# Patient Record
Sex: Female | Born: 1960
Health system: Southern US, Community
[De-identification: ages and names within clinical notes are randomized; demographics above are authoritative.]

## PROBLEM LIST (undated history)

## (undated) DIAGNOSIS — F329 Major depressive disorder, single episode, unspecified: Secondary | ICD-10-CM

## (undated) DIAGNOSIS — K589 Irritable bowel syndrome without diarrhea: Secondary | ICD-10-CM

## (undated) DIAGNOSIS — F32A Depression, unspecified: Secondary | ICD-10-CM

## (undated) DIAGNOSIS — K219 Gastro-esophageal reflux disease without esophagitis: Secondary | ICD-10-CM

## (undated) HISTORY — DX: Major depressive disorder, single episode, unspecified: F32.9

## (undated) HISTORY — PX: CHOLECYSTECTOMY: SHX55

## (undated) HISTORY — PX: LITHOTRIPSY: SUR834

## (undated) HISTORY — DX: Depression, unspecified: F32.A

## (undated) HISTORY — DX: Gastro-esophageal reflux disease without esophagitis: K21.9

## (undated) HISTORY — DX: Irritable bowel syndrome, unspecified: K58.9

## (undated) HISTORY — PX: GALLBLADDER SURGERY: SHX652

---

## 1998-03-29 ENCOUNTER — Encounter: Admission: RE | Admit: 1998-03-29 | Discharge: 1998-03-29 | Payer: Self-pay | Admitting: Internal Medicine

## 1998-05-02 ENCOUNTER — Ambulatory Visit: Admission: RE | Admit: 1998-05-02 | Discharge: 1998-05-02 | Payer: Self-pay | Admitting: Internal Medicine

## 1998-05-08 ENCOUNTER — Ambulatory Visit: Admission: RE | Admit: 1998-05-08 | Discharge: 1998-05-08 | Payer: Self-pay | Admitting: Internal Medicine

## 1998-08-30 ENCOUNTER — Encounter: Admission: RE | Admit: 1998-08-30 | Discharge: 1998-08-30 | Payer: Self-pay | Admitting: Internal Medicine

## 1998-08-30 ENCOUNTER — Encounter: Payer: Self-pay | Admitting: Internal Medicine

## 1998-08-30 ENCOUNTER — Ambulatory Visit (HOSPITAL_COMMUNITY): Admission: RE | Admit: 1998-08-30 | Discharge: 1998-08-30 | Payer: Self-pay | Admitting: Internal Medicine

## 1999-06-19 ENCOUNTER — Encounter: Admission: RE | Admit: 1999-06-19 | Discharge: 1999-06-19 | Payer: Self-pay | Admitting: Internal Medicine

## 2000-09-04 ENCOUNTER — Ambulatory Visit (HOSPITAL_COMMUNITY): Admission: RE | Admit: 2000-09-04 | Discharge: 2000-09-04 | Payer: Self-pay

## 2000-09-04 ENCOUNTER — Encounter: Admission: RE | Admit: 2000-09-04 | Discharge: 2000-09-04 | Payer: Self-pay

## 2000-09-16 ENCOUNTER — Encounter: Admission: RE | Admit: 2000-09-16 | Discharge: 2000-09-16 | Payer: Self-pay | Admitting: Internal Medicine

## 2000-10-21 ENCOUNTER — Emergency Department (HOSPITAL_COMMUNITY): Admission: EM | Admit: 2000-10-21 | Discharge: 2000-10-21 | Payer: Self-pay

## 2000-10-21 ENCOUNTER — Encounter: Payer: Self-pay | Admitting: Emergency Medicine

## 2000-11-05 ENCOUNTER — Encounter: Admission: RE | Admit: 2000-11-05 | Discharge: 2000-11-05 | Payer: Self-pay | Admitting: Internal Medicine

## 2002-01-03 ENCOUNTER — Encounter: Admission: RE | Admit: 2002-01-03 | Discharge: 2002-01-03 | Payer: Self-pay

## 2002-03-24 ENCOUNTER — Encounter: Admission: RE | Admit: 2002-03-24 | Discharge: 2002-03-24 | Payer: Self-pay | Admitting: Internal Medicine

## 2003-11-08 ENCOUNTER — Encounter: Admission: RE | Admit: 2003-11-08 | Discharge: 2003-11-08 | Payer: Self-pay | Admitting: Internal Medicine

## 2003-11-13 ENCOUNTER — Encounter: Admission: RE | Admit: 2003-11-13 | Discharge: 2003-11-13 | Payer: Self-pay | Admitting: Internal Medicine

## 2004-01-02 ENCOUNTER — Encounter: Admission: RE | Admit: 2004-01-02 | Discharge: 2004-01-02 | Payer: Self-pay | Admitting: Internal Medicine

## 2004-07-03 ENCOUNTER — Ambulatory Visit: Admission: RE | Admit: 2004-07-03 | Discharge: 2004-07-03 | Payer: Self-pay | Admitting: Specialist

## 2004-12-09 ENCOUNTER — Encounter: Payer: Self-pay | Admitting: Internal Medicine

## 2005-03-01 ENCOUNTER — Emergency Department (HOSPITAL_COMMUNITY): Admission: EM | Admit: 2005-03-01 | Discharge: 2005-03-01 | Payer: Self-pay | Admitting: Emergency Medicine

## 2005-04-09 ENCOUNTER — Ambulatory Visit: Payer: Self-pay | Admitting: Internal Medicine

## 2006-10-17 DIAGNOSIS — F325 Major depressive disorder, single episode, in full remission: Secondary | ICD-10-CM | POA: Insufficient documentation

## 2006-10-17 DIAGNOSIS — K589 Irritable bowel syndrome without diarrhea: Secondary | ICD-10-CM | POA: Insufficient documentation

## 2006-10-17 DIAGNOSIS — K219 Gastro-esophageal reflux disease without esophagitis: Secondary | ICD-10-CM

## 2007-02-25 ENCOUNTER — Ambulatory Visit: Payer: Self-pay | Admitting: Family Medicine

## 2009-06-19 ENCOUNTER — Encounter: Admission: RE | Admit: 2009-06-19 | Discharge: 2009-06-19 | Payer: Self-pay | Admitting: Family Medicine

## 2009-06-19 ENCOUNTER — Encounter: Payer: Self-pay | Admitting: Family Medicine

## 2009-07-16 ENCOUNTER — Ambulatory Visit (HOSPITAL_COMMUNITY): Admission: RE | Admit: 2009-07-16 | Discharge: 2009-07-18 | Payer: Self-pay | Admitting: General Surgery

## 2009-07-16 ENCOUNTER — Encounter (INDEPENDENT_AMBULATORY_CARE_PROVIDER_SITE_OTHER): Payer: Self-pay | Admitting: General Surgery

## 2009-09-10 ENCOUNTER — Ambulatory Visit (HOSPITAL_COMMUNITY): Admission: RE | Admit: 2009-09-10 | Discharge: 2009-09-10 | Payer: Self-pay | Admitting: Urology

## 2009-12-20 ENCOUNTER — Ambulatory Visit (HOSPITAL_COMMUNITY): Admission: RE | Admit: 2009-12-20 | Discharge: 2009-12-20 | Payer: Self-pay | Admitting: Urology

## 2010-02-20 ENCOUNTER — Encounter: Payer: Self-pay | Admitting: Internal Medicine

## 2010-02-20 ENCOUNTER — Encounter (INDEPENDENT_AMBULATORY_CARE_PROVIDER_SITE_OTHER): Payer: Self-pay | Admitting: *Deleted

## 2010-02-20 ENCOUNTER — Ambulatory Visit: Payer: Self-pay | Admitting: Family Medicine

## 2010-02-20 DIAGNOSIS — R142 Eructation: Secondary | ICD-10-CM

## 2010-02-20 DIAGNOSIS — Z780 Asymptomatic menopausal state: Secondary | ICD-10-CM | POA: Insufficient documentation

## 2010-02-20 DIAGNOSIS — D1739 Benign lipomatous neoplasm of skin and subcutaneous tissue of other sites: Secondary | ICD-10-CM

## 2010-02-20 DIAGNOSIS — R143 Flatulence: Secondary | ICD-10-CM

## 2010-02-20 DIAGNOSIS — R141 Gas pain: Secondary | ICD-10-CM | POA: Insufficient documentation

## 2010-02-21 ENCOUNTER — Encounter: Payer: Self-pay | Admitting: Family Medicine

## 2010-02-21 ENCOUNTER — Telehealth (INDEPENDENT_AMBULATORY_CARE_PROVIDER_SITE_OTHER): Payer: Self-pay | Admitting: *Deleted

## 2010-02-21 LAB — CONVERTED CEMR LAB
Hep B S Ab: NEGATIVE
Sex Hormone Binding: 32 nmol/L (ref 18–114)
Testosterone Free: 4.6 pg/mL (ref 0.6–6.8)
Testosterone-% Free: 1.8 % (ref 0.4–2.4)

## 2010-02-22 LAB — CONVERTED CEMR LAB
BUN: 9 mg/dL (ref 6–23)
Basophils Relative: 0.4 % (ref 0.0–3.0)
CO2: 32 meq/L (ref 19–32)
Calcium: 9.3 mg/dL (ref 8.4–10.5)
Chloride: 102 meq/L (ref 96–112)
Direct LDL: 141.3 mg/dL
Eosinophils Absolute: 0.1 10*3/uL (ref 0.0–0.7)
HDL: 43.8 mg/dL (ref >39.00)
Lymphocytes Relative: 13.2 % (ref 12.0–46.0)
Lymphs Abs: 0.9 10*3/uL (ref 0.7–4.0)
MCV: 92.2 fL (ref 78.0–100.0)
Monocytes Relative: 7.9 % (ref 3.0–12.0)
Neutrophils Relative %: 77.4 % — ABNORMAL HIGH (ref 43.0–77.0)
Potassium: 4.4 meq/L (ref 3.5–5.1)
RBC: 4.06 M/uL (ref 3.87–5.11)
TSH: 2.53 microintl units/mL (ref 0.35–5.50)
Total CHOL/HDL Ratio: 5
Total Protein: 7.7 g/dL (ref 6.0–8.3)
VLDL: 48 mg/dL — ABNORMAL HIGH (ref 0.0–40.0)

## 2010-03-19 ENCOUNTER — Encounter: Admission: RE | Admit: 2010-03-19 | Discharge: 2010-03-19 | Payer: Self-pay | Admitting: Family Medicine

## 2010-03-19 ENCOUNTER — Telehealth (INDEPENDENT_AMBULATORY_CARE_PROVIDER_SITE_OTHER): Payer: Self-pay | Admitting: *Deleted

## 2010-03-26 ENCOUNTER — Telehealth (INDEPENDENT_AMBULATORY_CARE_PROVIDER_SITE_OTHER): Payer: Self-pay | Admitting: *Deleted

## 2010-03-27 ENCOUNTER — Encounter: Payer: Self-pay | Admitting: Family Medicine

## 2010-04-03 ENCOUNTER — Encounter: Payer: Self-pay | Admitting: Family Medicine

## 2010-04-11 ENCOUNTER — Encounter (INDEPENDENT_AMBULATORY_CARE_PROVIDER_SITE_OTHER): Payer: Self-pay | Admitting: *Deleted

## 2010-04-15 ENCOUNTER — Ambulatory Visit: Payer: Self-pay | Admitting: Internal Medicine

## 2010-05-02 ENCOUNTER — Ambulatory Visit: Payer: Self-pay | Admitting: Internal Medicine

## 2010-08-28 IMAGING — RF DG ERCP WO/W SPHINCTEROTOMY
5 series · 5 of 5 positions shown · non-contrast
Comparison: Intraoperative cholangiogram 07/16/2009

CLINICAL DATA: Retained common duct stone at cholecystectomy

ERCP
TECHNIQUE: Multiple spot images obtained with the fluoroscopic
device and submitted for interpretation post-procedure.  ERCP was
performed by Dr. Claus.

[Series 1: cont. · 1 of 1 slices shown (1 of 5)]
[im 1/1]
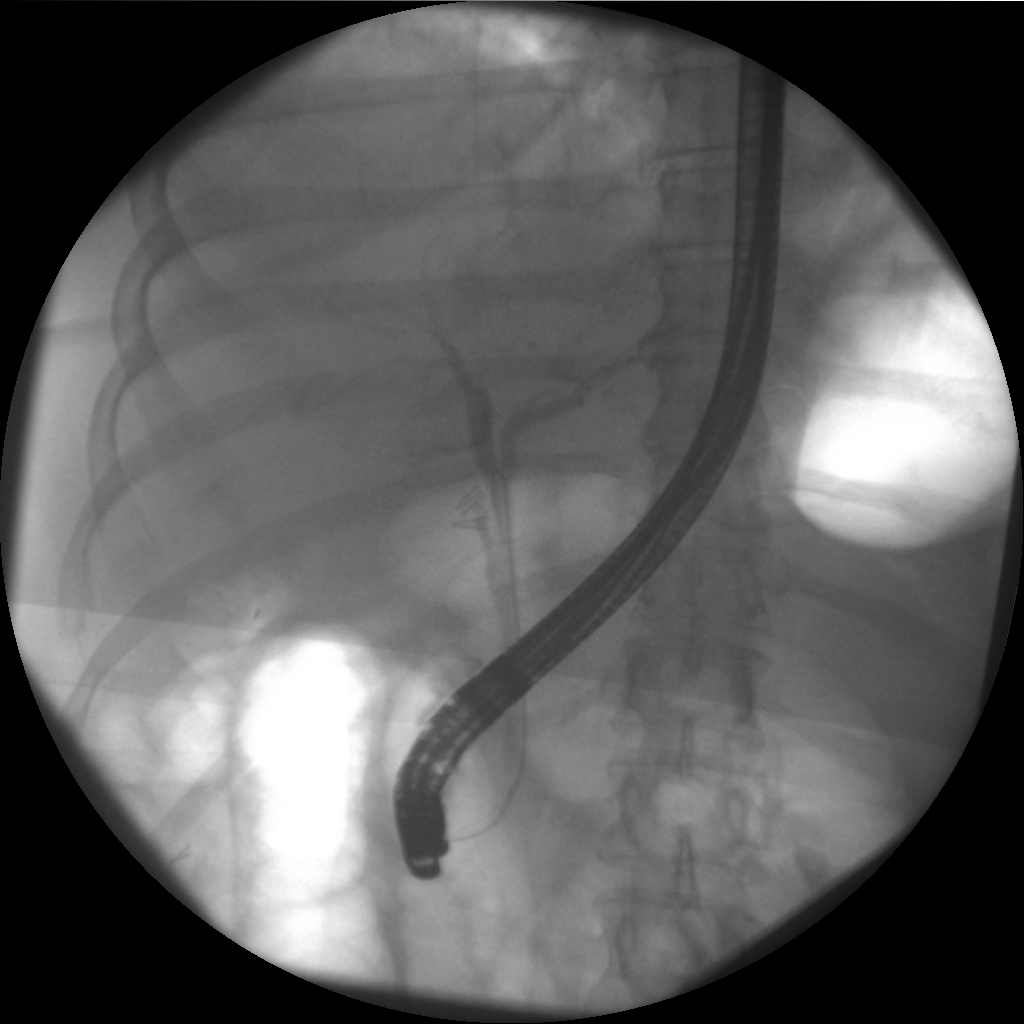

[Series 2: cont. · 1 of 1 slices shown (2 of 5)]
[im 1/1]
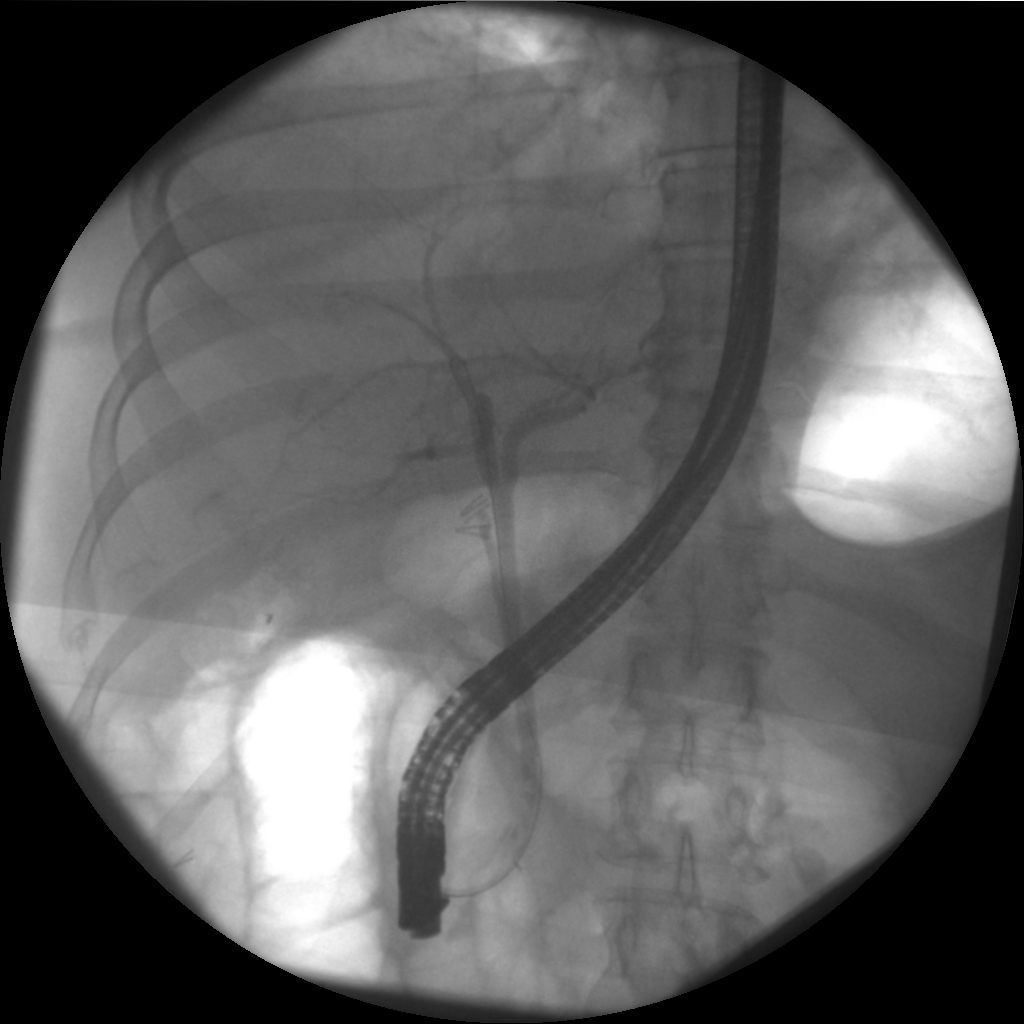

[Series 3: cont. · 1 of 1 slices shown (3 of 5)]
[im 1/1]
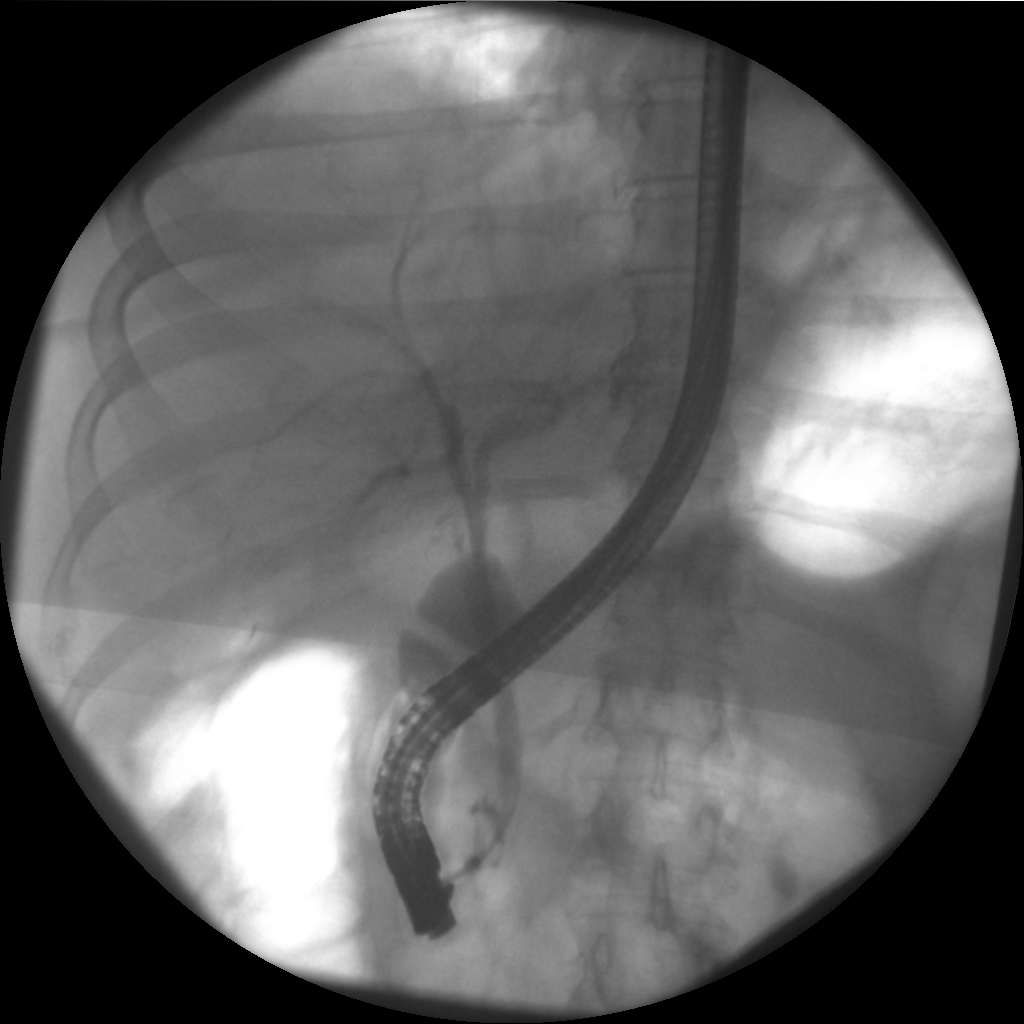

[Series 4: cont. · 1 of 1 slices shown (4 of 5)]
[im 1/1]
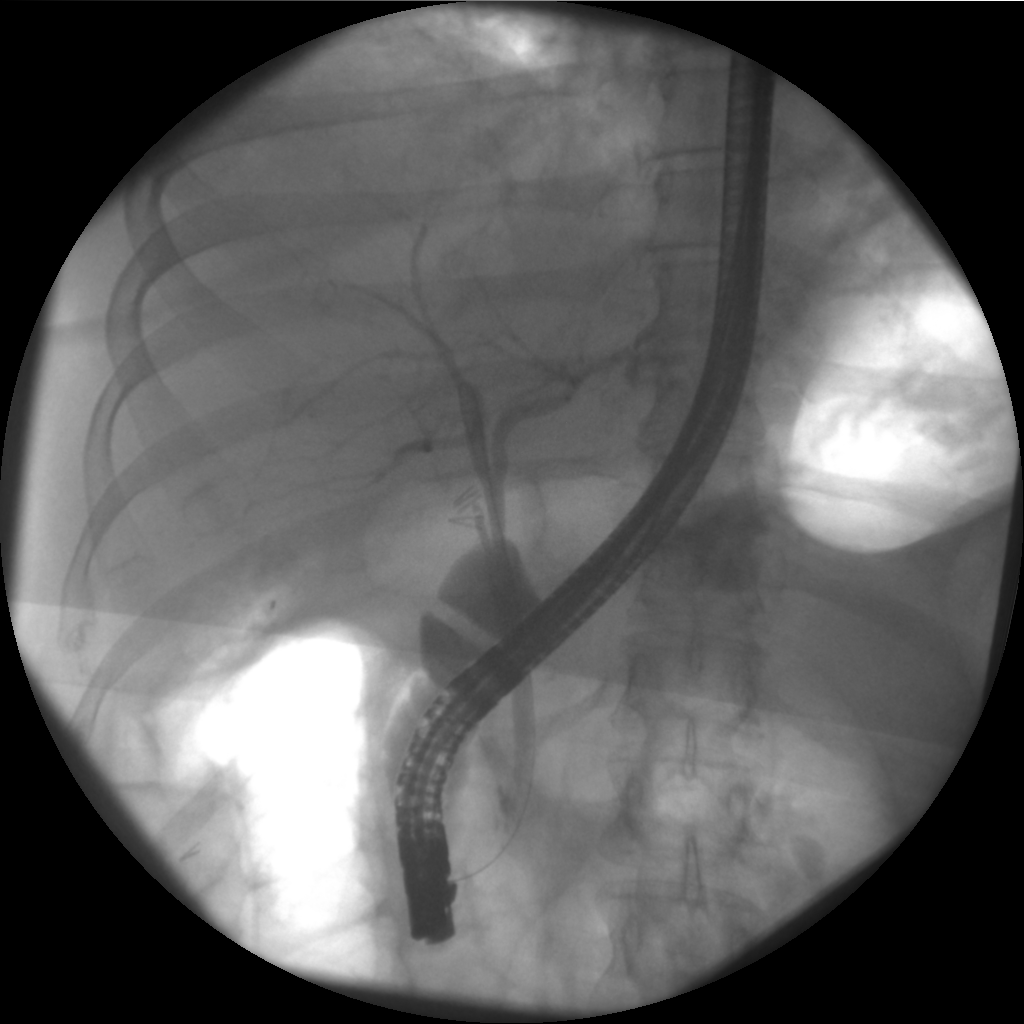

[Series 5: cont. · 1 of 1 slices shown (5 of 5)]
[im 1/1]
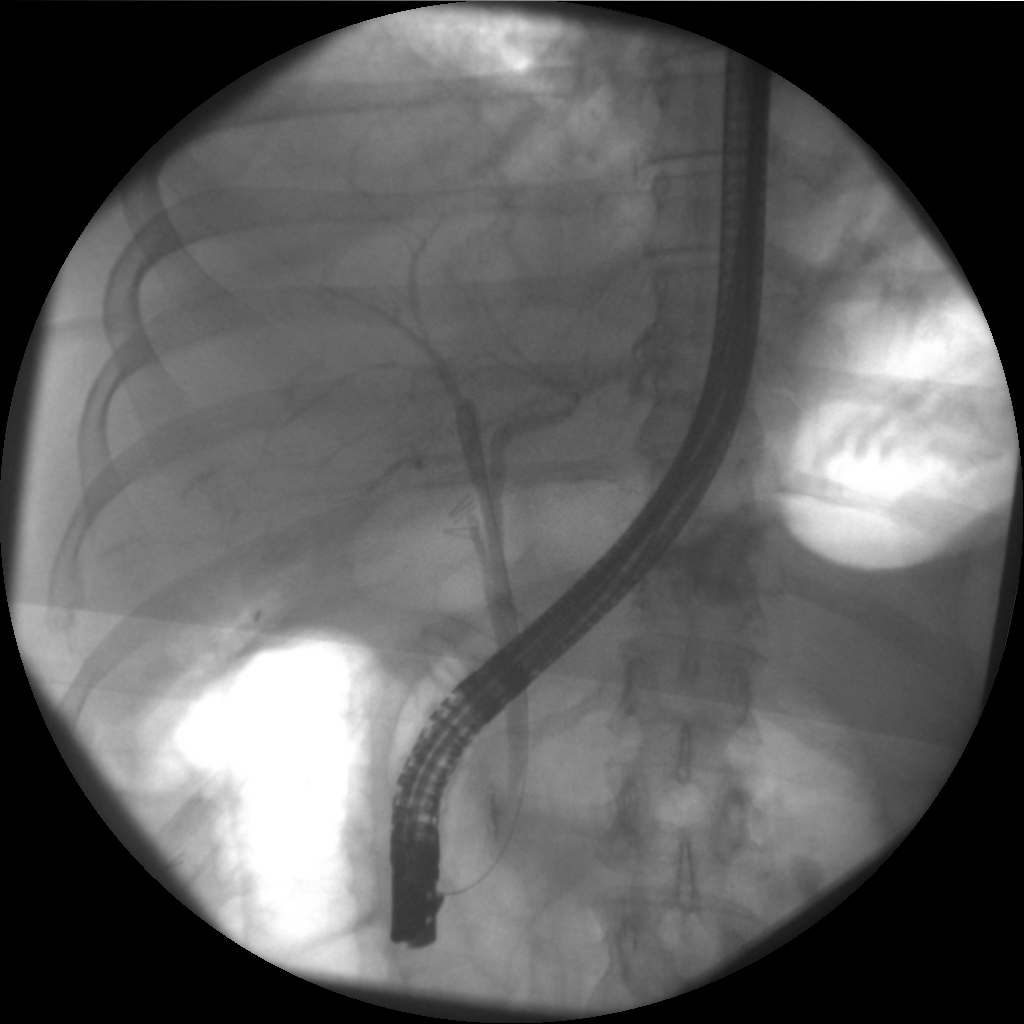

[5 of 5 positions shown; findings below may reference images not displayed]

FINDINGS: Five spot fluoroscopic images are presented for
evaluation.  Cholecystectomy clips are noted.  Common and
intrahepatic ducts are normal in caliber.  Cystic duct remnant is
partly opacified.  There is smooth tapering of the common duct to
the level of the ampulla with minimal apparent passage of contrast
into probable bowel.  There is a possible filling defect seen on
the second fluoroscopic image which is not visible on the last
provided image.
IMPRESSION: Apparent resolution of previously seen retained common duct stone.
No ductal dilatation.

These images were submitted for radiologic interpretation only.
Please see the procedural report for the amount of contrast and the
fluoroscopy time utilized.

## 2010-10-30 ENCOUNTER — Telehealth: Payer: Self-pay | Admitting: Family Medicine

## 2010-11-05 ENCOUNTER — Ambulatory Visit
Admission: RE | Admit: 2010-11-05 | Discharge: 2010-11-05 | Payer: Self-pay | Source: Home / Self Care | Attending: Family Medicine | Admitting: Family Medicine

## 2010-11-05 ENCOUNTER — Encounter: Payer: Self-pay | Admitting: Family Medicine

## 2010-11-05 ENCOUNTER — Other Ambulatory Visit: Payer: Self-pay | Admitting: Family Medicine

## 2010-11-05 DIAGNOSIS — E785 Hyperlipidemia, unspecified: Secondary | ICD-10-CM | POA: Insufficient documentation

## 2010-11-05 DIAGNOSIS — R599 Enlarged lymph nodes, unspecified: Secondary | ICD-10-CM | POA: Insufficient documentation

## 2010-11-05 LAB — BASIC METABOLIC PANEL
BUN: 14 mg/dL (ref 6–23)
CO2: 31 mEq/L (ref 19–32)
Calcium: 9.5 mg/dL (ref 8.4–10.5)
Chloride: 105 mEq/L (ref 96–112)
Creatinine, Ser: 0.7 mg/dL (ref 0.4–1.2)
GFR: 96.95 mL/min (ref >60.00)
Glucose, Bld: 91 mg/dL (ref 70–99)
Potassium: 4.5 mEq/L (ref 3.5–5.1)
Sodium: 142 mEq/L (ref 135–145)

## 2010-11-05 LAB — CBC WITH DIFFERENTIAL/PLATELET
Basophils Absolute: 0.1 10*3/uL (ref 0.0–0.1)
Basophils Relative: 0.8 % (ref 0.0–3.0)
Eosinophils Absolute: 0.2 10*3/uL (ref 0.0–0.7)
Eosinophils Relative: 2.8 % (ref 0.0–5.0)
HCT: 37.5 % (ref 36.0–46.0)
Hemoglobin: 12.7 g/dL (ref 12.0–15.0)
Lymphocytes Relative: 23.1 % (ref 12.0–46.0)
Lymphs Abs: 1.7 10*3/uL (ref 0.7–4.0)
MCHC: 33.9 g/dL (ref 30.0–36.0)
MCV: 94 fl (ref 78.0–100.0)
Monocytes Absolute: 0.5 10*3/uL (ref 0.1–1.0)
Monocytes Relative: 6.8 % (ref 3.0–12.0)
Neutro Abs: 5 10*3/uL (ref 1.4–7.7)
Neutrophils Relative %: 66.5 % (ref 43.0–77.0)
Platelets: 191 10*3/uL (ref 150.0–400.0)
RBC: 3.99 Mil/uL (ref 3.87–5.11)
RDW: 14.1 % (ref 11.5–14.6)
WBC: 7.5 10*3/uL (ref 4.5–10.5)

## 2010-11-07 ENCOUNTER — Encounter (INDEPENDENT_AMBULATORY_CARE_PROVIDER_SITE_OTHER): Payer: Self-pay | Admitting: *Deleted

## 2010-11-18 ENCOUNTER — Telehealth (INDEPENDENT_AMBULATORY_CARE_PROVIDER_SITE_OTHER): Payer: Self-pay | Admitting: *Deleted

## 2010-11-26 ENCOUNTER — Ambulatory Visit
Admission: RE | Admit: 2010-11-26 | Discharge: 2010-11-26 | Payer: Self-pay | Source: Home / Self Care | Attending: Family Medicine | Admitting: Family Medicine

## 2010-11-26 DIAGNOSIS — G5600 Carpal tunnel syndrome, unspecified upper limb: Secondary | ICD-10-CM | POA: Insufficient documentation

## 2010-11-26 DIAGNOSIS — R0609 Other forms of dyspnea: Secondary | ICD-10-CM | POA: Insufficient documentation

## 2010-11-26 DIAGNOSIS — R0989 Other specified symptoms and signs involving the circulatory and respiratory systems: Secondary | ICD-10-CM | POA: Insufficient documentation

## 2010-11-28 NOTE — Assessment & Plan Note (Signed)
Summary: CPX AND FASTING LABS, NO PAP///SPH   Vital Signs:  Patient profile:   50 year old female Height:      62 inches Weight:      207 pounds BMI:     38.00 Pulse rate:   76 / minute Pulse rhythm:   regular BP sitting:   122 / 80  (left arm) Cuff size:   regular  Vitals Entered By: Army Fossa CMA (February 20, 2010 8:30 AM) CC: Pt here for CPX and labs- pt is fasting no pap.     Last PAP Date 02/20/2010 Last PAP Result refused   History of Present Illness: Pt here to establish and have cpe.  Pt is fasting.  Pt does not want a pap because she doesn't feel like she needs one.  She does not sleep with men.   Pt having issues with hemrrhoids for 1 year.  No issues with constipation or straining.  They do bleed at times.  She has tried otc meds with no relief.    Pt is also having problems with hot flashes and decreased sex drive.  She feels like her hormones are "out of wack".---  no period since 04/2009 Pt with hx of psoriasis never diagnosed by derm -  pt using lotrimen on it with no relief.   Pt also has rash on inside R foot---very itchy.   Pt with hx kidney stones for about 1-2 years--- pt sees Dr Vonita Moss for this.  Pt is also c/o abd bloating---pt had abd [place] last  Pt also c/o lumps on bottom of feet --x 3 years--not painful.  Preventive Screening-Counseling & Management  Alcohol-Tobacco     Alcohol drinks/day: 0     Smoking Status: never  Caffeine-Diet-Exercise     Caffeine use/day: 3     Does Patient Exercise: no     Exercise Counseling: to improve exercise regimen  Hep-HIV-STD-Contraception     Dental Visit-last 6 months yes     Dental Care Counseling: not indicated; dental care within six months     SBE monthly: no     SBE Education/Counseling: to perform regular SBE      Sexual History:  same sex encounters and partner 4 years.        Drug Use:  never.    Current Medications (verified): 1)  Bee Pollen/royal Jelly/honey 1000 Mg Tabs (Nutritional  Supplements) .... 2 By Mouth Two Times A Day 2)  Zoloft 100 Mg Tabs (Sertraline Hcl) .Marland Kitchen.. 1 1/2 By Mouth Once Daily 3)  Lotrisone 1-0.05 % Crea (Clotrimazole-Betamethasone) .... Apply Two Times A Day As Needed 4)  Proctosol Hc 2.5 % Crea (Hydrocortisone) .... 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 Heart disease Family History of Alcoholism/Addiction Family History of Arthritis Family History Breast cancer 1st degree relative <50 Family  History Diabetes 1st degree relative Family History High cholesterol Family History of Stroke M 1st degree relative <50  Social History: Reviewed history and no changes required. 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 Eyes:  Denies blurring, discharge, double vision, eye irritation, eye pain, halos, itching, light sensitivity, red eye, vision loss-1 eye, and vision loss-both eyes; optho--q2y . ENT:  Denies decreased hearing, difficulty swallowing, ear discharge, earache, hoarseness, nasal congestion, nosebleeds, postnasal drainage, ringing in ears, sinus pressure, and sore throat. CV:  Denies bluish discoloration of lips or nails, chest pain or discomfort, difficulty breathing at night, difficulty breathing while lying down, fainting, fatigue, leg cramps with exertion, lightheadness, near fainting, palpitations, shortness of breath with exertion, swelling of feet, swelling of hands, and weight gain. Resp:  Denies chest discomfort, chest pain with inspiration, cough, coughing up blood, excessive snoring, hypersomnolence, morning headaches, pleuritic, shortness of breath, sputum productive, and wheezing. GI:  Complains of hemorrhoids; denies abdominal pain, bloody stools, change in bowel habits, constipation, dark tarry stools, diarrhea, excessive appetite, gas, indigestion, loss of appetite, nausea, and vomiting; + abd bloating since gb surgery. GU:  Denies abnormal vaginal bleeding, decreased libido, discharge, dysuria, genital sores, hematuria, incontinence, nocturia, urinary frequency, and urinary hesitancy. MS:  Denies joint pain, joint redness, joint swelling, loss of strength, low back pain, mid back pain, muscle aches, muscle , cramps, muscle weakness, stiffness, and thoracic pain. Derm:  Denies changes in color of skin, changes in nail beds, dryness, excessive perspiration, flushing, hair loss, insect bite(s), itching, lesion(s), poor wound healing, and rash; + dry white patches. Neuro:  Denies brief paralysis, difficulty with concentration, disturbances in coordination, falling down, headaches, inability to speak, memory loss, numbness, poor balance, seizures, sensation of room spinning, tingling,  tremors, visual disturbances, and weakness. Psych:  Complains of depression; denies alternate hallucination ( auditory/visual), anxiety, easily angered, easily tearful, irritability, mental problems, panic attacks, sense of great danger, suicidal thoughts/plans, thoughts of violence, unusual visions or sounds, and thoughts /plans of harming others. Endo:  Denies cold intolerance, excessive hunger, excessive thirst, excessive urination, heat intolerance, polyuria, and weight change. Heme:  Denies abnormal bruising, bleeding, enlarge lymph nodes, fevers, pallor, and skin discoloration. Allergy:  Denies hives or rash, itching eyes, persistent infections, seasonal allergies, and sneezing.  Physical Exam  General:  Well-developed,well-nourished,in no acute distress; alert,appropriate and cooperative throughout examination Head:  Normocephalic and atraumatic without obvious abnormalities. No apparent alopecia or balding. Eyes:  pupils equal, pupils round, pupils reactive to light, and no injection.   Ears:  External ear exam shows no significant lesions or deformities.  Otoscopic examination reveals clear canals, tympanic membranes are intact bilaterally without bulging, retraction, inflammation or discharge. Hearing is grossly normal bilaterally. Nose:  External nasal examination shows no deformity or inflammation. Nasal mucosa are pink and moist without lesions or exudates. Mouth:  Oral mucosa and oropharynx without lesions or exudates.  Teeth in good repair. Neck:  No deformities, masses, or tenderness noted.no carotid bruits.   Chest Wall:  No deformities, masses, or tenderness noted. Breasts:  No mass, nodules, thickening, tenderness, bulging,  retraction, inflamation, nipple discharge or skin changes noted.   Lungs:  Normal respiratory effort, chest expands symmetrically. Lungs are clear to auscultation, no crackles or wheezes. Heart:  normal rate and no murmur.   Abdomen:  Bowel sounds  positive,abdomen soft and non-tender without masses, organomegaly or hernias noted. Rectal:  external hemorrhoid(s).   heme negative brown stool Genitalia:  refused Msk:  normal ROM, no joint tenderness, no joint swelling, no joint warmth, no redness over joints, no joint deformities, no joint instability, and no crepitation.   Pulses:  R posterior tibial normal, R dorsalis pedis normal, R carotid normal, L posterior tibial normal, L dorsalis pedis normal, and L carotid normal.   Extremities:  No clubbing, cyanosis, edema, or deformity noted with normal full range of motion of all joints.   Neurologic:  No cranial nerve deficits noted. Station and gait are normal. Plantar reflexes are down-going bilaterally. DTRs are symmetrical throughout. Sensory, motor and coordinative functions appear intact. Skin:  white scaley circular patches on hands and R medial foot lipomas palpated bottem both feet Cervical Nodes:  No lymphadenopathy noted Axillary Nodes:  No palpable lymphadenopathy Psych:  Cognition and judgment appear intact. Alert and cooperative with normal attention span and concentration. No apparent delusions, illusions, hallucinations   Impression & Recommendations:  Problem # 1:  PREVENTIVE HEALTH CARE (ICD-V70.0)  Orders: Gastroenterology Referral (GI) Venipuncture (16109) TLB-Lipid Panel (80061-LIPID) TLB-BMP (Basic Metabolic Panel-BMET) (80048-METABOL) TLB-CBC Platelet - w/Differential (85025-CBCD) TLB-Hepatic/Liver Function Pnl (80076-HEPATIC) TLB-TSH (Thyroid Stimulating Hormone) (84443-TSH) TLB-A1C / Hgb A1C (Glycohemoglobin) (60454-U9W) Radiology Referral (Radiology) T-Hepatitis B Surface Antibody (11914-78295) T- * Misc. Laboratory test [phone]) EKG w/ Interpretation (93000)  Problem # 2:  POSTMENOPAUSAL STATUS (ICD-V49.81)  Orders: TLB-FSH (Follicle Stimulating Hormone) (83001-FSH) T-Hepatitis B Surface Antibody (86578-46962) T- * Misc. Laboratory test [phone]) EKG  w/ Interpretation (93000)  Problem # 3:  DEPRESSION (ICD-311)  The following medications were removed from the medication list:    Zoloft Tabs (Sertraline hcl tabs) .Marland KitchenMarland KitchenMarland KitchenMarland Kitchen 150 mg once daily Her updated medication list for this problem includes:    Zoloft 100 Mg Tabs (Sertraline hcl) .Marland Kitchen... 1 1/2 by mouth once daily  Orders: Venipuncture (13244) TLB-Lipid Panel (80061-LIPID) TLB-BMP (Basic Metabolic Panel-BMET) (80048-METABOL) TLB-CBC Platelet - w/Differential (85025-CBCD) TLB-Hepatic/Liver Function Pnl (80076-HEPATIC) TLB-TSH (Thyroid Stimulating Hormone) (84443-TSH) TLB-A1C / Hgb A1C (Glycohemoglobin) (83036-A1C) EKG w/ Interpretation (93000)  Discussed treatment options, including trial of antidpressant medication. Will refer to behavioral health. Follow-up call in in 24-48 hours and recheck in 2 weeks, sooner as needed. Patient agrees to call if any worsening of symptoms or thoughts of doing harm arise. Verified that the patient has no suicidal ideation at this time.   Problem # 4:  ABDOMINAL BLOATING (ICD-787.3)  will need pelvic US if not done pt refusing pelvic exam   Orders: EKG w/ Interpretation (93000)  Problem # 5:  COUGH (ICD-786.2)  ?allergies  allegra, claritin etc---rto prn  Orders: EKG w/ Interpretation (93000)  Problem # 6:  LIPOMA OF OTHER SKIN AND SUBCUTANEOUS TISSUE (ICD-214.1)  Orders: Podiatry Referral (Podiatry) EKG w/ Interpretation (93000)  Complete Medication List: 1)  Bee Pollen/royal Jelly/honey 1000 Mg Tabs (Nutritional supplements) .... 2 by mouth two times a day 2)  Zoloft 100 Mg Tabs (Sertraline hcl) .Marland Kitchen.. 1 1/2 by mouth once daily 3)  Lotrisone 1-0.05 % Crea (Clotrimazole-betamethasone) .... Apply two times a day as needed 4)  Proctosol Hc 2.5 % Crea (Hydrocortisone) .... Apply up to qid  Other Orders: Tdap => 24yrs  IM (16109) Hepatitis A Vaccine (Adult Dose) (60454) Admin 1st Vaccine (09811) Admin of Any Addtl Vaccine  (91478)  Patient Instructions: 1)  Take allegra or claritin otc for post nasal drip 2)  We will check Ultrasound to see if we need to order a pelvic US 3)  Call if hemorhoids or skin rash do not clear up Prescriptions: PROCTOSOL HC 2.5 % CREA (HYDROCORTISONE) apply up to qid  #1 tube x 1   Entered and Authorized by:   Loreen Freud DO   Signed by:   Loreen Freud DO on 02/20/2010   Method used:   Electronically to        [facility] [phone]* (retail)       [address] [place]  [address]       Ph: [phone] or [phone]       Fax: [phone]   RxID:   [id number] LOTRISONE 1-0.05 % CREA (CLOTRIMAZOLE-BETAMETHASONE) apply two times a day as needed  #30 g x 2   Entered and Authorized by:   Loreen Freud DO   Signed by:   Loreen Freud DO on 02/20/2010   Method used:   Electronically to        [facility] [phone]* (retail)       [address], [place]  [address]       Ph: [phone] or [phone]       Fax: [phone]   RxID:   [id number] ZOLOFT 100 MG TABS (SERTRALINE HCL) 1 1/2 by mouth once daily  #45 x 5   Entered and Authorized by:   Loreen Freud DO   Signed by:   Loreen Freud DO on 02/20/2010   Method used:   Print then Give to Patient   RxID:   [id number] ZOLOFT 50 MG TABS (SERTRALINE HCL) 1/2 by mouth once daily for 1 week then 1 by mouth once daily for 2 weeks then 2 by mouth once daily  #42 x 0   Entered and Authorized by:   Loreen Freud DO   Signed by:   Loreen Freud DO on 02/20/2010   Method used:   Electronically to        [facility] [phone]* (retail)       [address], [place]  [address]       Ph: [phone] or [phone]       Fax: [phone]   RxID:   [id number]    EKG  Procedure date:  02/20/2010  Findings:      Normal sinus rhythm with rate of:  70 bpm   Flu Vaccine Result Date:  08/08/2009 Flu Vaccine Result:  given Flu Vaccine Next Due:  1 yr PAP Result Date:   02/20/2010 PAP Result:  refused   Immunizations Administered:  Tetanus Vaccine:    Vaccine Type: Tdap    Site: right deltoid    Mfr: GlaxoSmithKline    Dose: 0.5 ml    Route: IM    Given by: Army Fossa CMA    Exp. Date: 01/19/2012    Lot #: [id number]  Hepatitis A Vaccine # 1:    Vaccine Type: HepA    Site: left deltoid    Mfr: GlaxoSmithKline    Dose: 1.0 ml    Route: IM    Given by: Army Fossa CMA  Exp. Date: 02/13/2012    Lot #: [id number]  Appended Document: Orders Update(lmom 4/27) [place] reviewd----pt needs US pelvis secondary to abd bloating  Left message for pt. Army Fossa CMA  February 20, 2010 1:03 PM  Pt has appt today 02/22/10. Army Fossa CMA  February 22, 2010 8:27 AM  Clinical Lists Changes  Orders: Added new Service order of UA Dipstick w/o Micro (manual) (09811) - Signed Added new Service order of Specimen Handling (99000) - Signed Added new Test order of T-Culture, Urine ([id number]) - Signed Added new Referral order of Radiology Referral (Radiology) - Signed Observations: Added new observation of COMMENTS: Floydene Flock  February 20, 2010 1:00 PM cx sent (02/20/2010 12:07) Added new observation of PH URINE: 5.0  (02/20/2010 12:07) Added new observation of APPEARANCE U: Clear  (02/20/2010 12:07) Added new observation of SPEC GR URIN: 1.020  (02/20/2010 12:07) Added new observation of UA COLOR: yellow  (02/20/2010 12:07) Added new observation of WBC DIPSTK U: small  (02/20/2010 12:07) Added new observation of NITRITE URN: negative  (02/20/2010 12:07) Added new observation of UROBILINOGEN: negative  (02/20/2010 12:07) Added new observation of PROTEIN, URN: negative  (02/20/2010 12:07) Added new observation of BLOOD UR DIP: small  (02/20/2010 12:07) Added new observation of KETONES URN: negative  (02/20/2010 12:07) Added new observation of BILIRUBIN UR: negative  (02/20/2010 12:07) Added new observation of GLUCOSE, URN: negative  (02/20/2010  12:07)      Laboratory Results   Urine Tests   Date/Time Reported: February 20, 2010 1:00 PM   Routine Urinalysis   Color: yellow Appearance: Clear Glucose: negative   (Normal Range: Negative) Bilirubin: negative   (Normal Range: Negative) Ketone: negative   (Normal Range: Negative) Spec. Gravity: 1.020   (Normal Range: 1.003-1.035) Blood: small   (Normal Range: Negative) pH: 5.0   (Normal Range: 5.0-8.0) Protein: negative   (Normal Range: Negative) Urobilinogen: negative   (Normal Range: 0-1) Nitrite: negative   (Normal Range: Negative) Leukocyte Esterace: small   (Normal Range: Negative)    Comments: Floydene Flock  February 20, 2010 1:00 PM cx sent

## 2010-11-28 NOTE — Progress Notes (Signed)
Summary: Lab Results   Phone Note Outgoing Call   Call placed by: Army Fossa CMA,  February 21, 2010 4:26 PM Summary of Call: Regarding lab results, LMTCB:  hep B neg--pt would need hep b vaccine fax estradial to gyn Signed by Loreen Freud DO on 02/21/2010 at 4:19 PM  send labs to gyn for hormone levels Ideally your LDL (bad cholesterol) should be <_100___, your HDL (good cholesterol) should be >_40__ and your triglycerides should be< 150.  Diet and exercise will increase HDL and decrease the LDL and triglycerides. Read Dr. Danice Goltz book--Eat Drink and Be Healthy.  We will recheck labs in __3_ months.  If still elevated we will discuss options------  NMR, hep 272.4  schedule ov 2 weeks after labs    Follow-up for Phone Call        Left message to call back. Army Fossa CMA  February 22, 2010 8:30 AM   Additional Follow-up for Phone Call Additional follow up Details #1::        I spoke with pt and she states she does not have a GYN doctor, she know that you will be out of the office until monday. Army Fossa CMA  February 22, 2010 8:54 AM     Additional Follow-up for Phone Call Additional follow up Details #2::    probably post menopausal---testosterone normal pt will need hep b vaccine as well as A Follow-up by: Loreen Freud DO,  Feb 25, 2010 12:11 PM  Additional Follow-up for Phone Call Additional follow up Details #3:: Details for Additional Follow-up Action Taken: Left message to call back. Army Fossa CMA  Feb 25, 2010 1:45 PM  Left message to call back, urine culture results are back also:  contaminated---repeat if symptomatic Signed by Loreen Freud DO on 02/25/2010 at 5:29 PM  Pt is aware. Army Fossa CMA  Feb 26, 2010 1:47 PM

## 2010-11-28 NOTE — Progress Notes (Signed)
Summary: needs appt to discuss labs    ---- Converted from flag ---- ---- 11/18/2010 8:07 AM, Almeta Monas CMA (AAMA) wrote:  please schedule. Thank You ---- 11/15/2010 1:09 PM, Loreen Freud DO wrote: pt needs ov soon to discuss labs ------------------------------       Additional Follow-up for Phone Call Additional follow up Details #2::    lmom ([phone]) to call and make regular followup appt to discuss labs.Sydney Thompson  November 18, 2010 1:10 PM  Additional Follow-up for Phone Call Additional follow up Details #3:: Details for Additional Follow-up Action Taken: Patient is coming in 1.31.11. Additional Follow-up by: Harold Barban,  November 18, 2010 2:47 PM

## 2010-11-28 NOTE — Letter (Signed)
Summary: Previsit letter  [facility]  [address] [place], [place] [address]   Phone: [phone]  Fax: [phone]       02/20/2010 MRN: [id number]  Sydney Thompson [address] [place], [place]  [address]  Dear Ms. [name],  Welcome to the Gastroenterology Division at [facility]    You are scheduled to see a nurse for your pre-procedure visit on 04/15/2010 at 8:00AM on the 3rd floor at [facility], [address]  We ask that you try to arrive at our office 15 minutes prior to your appointment time to allow for check-in.  Your nurse visit will consist of discussing your medical and surgical history, your immediate family medical history, and your medications.    Please bring a complete list of all your medications or, if you prefer, bring the medication bottles and we will list them.  We will need to be aware of both prescribed and over the counter drugs.  We will need to know exact dosage information as well.  If you are on blood thinners (Coumadin, Plavix, Aggrenox, Ticlid, etc.) please call our office today/prior to your appointment, as we need to consult with your physician about holding your medication.   Please be prepared to read and sign documents such as consent forms, a financial agreement, and acknowledgement forms.  If necessary, and with your consent, a friend or relative is welcome to sit-in on the nurse visit with you.  Please bring your insurance card so that we may make a copy of it.  If your insurance requires a referral to see a specialist, please bring your referral form from your primary care physician.  No co-pay is required for this nurse visit.     If you cannot keep your appointment, please call [phone] to cancel or reschedule prior to your appointment date.  This allows [place] the opportunity to schedule an appointment for another patient in need of care.    Thank you for choosing [facility] for your medical  needs.  We appreciate the opportunity to care for you.  Please visit [place] at our website  to learn more about our practice.                     Sincerely.                                                                                                                   The Gastroenterology Division

## 2010-11-28 NOTE — Letter (Signed)
Summary: [facility] Instructions  [facility]  [address] [place], [place] [address]   Phone: [phone]  Fax: [phone]       Dafney GINTZ    [birth year]    MRN: [id number]        Procedure Day /Date: Thursday 05/02/2010     Arrival Time: 12:30 pm      Procedure Time: 1:30 pm     Location of Procedure:                    _x _  [facility] (4th Floor)                        PREPARATION FOR COLONOSCOPY WITH MOVIPREP   Starting 5 days prior to your procedure Saturday 7/2 do not eat nuts, seeds, popcorn, corn, beans, peas,  salads, or any raw vegetables.  Do not take any fiber supplements (e.g. Metamucil, Citrucel, and Benefiber).  THE DAY BEFORE YOUR PROCEDURE         DATE: Wednesday 7/6  1.  Drink clear liquids the entire day-NO SOLID FOOD  2.  Do not drink anything colored red or purple.  Avoid juices with pulp.  No orange juice.  3.  Drink at least 64 oz. (8 glasses) of fluid/clear liquids during the day to prevent dehydration and help the prep work efficiently.  CLEAR LIQUIDS INCLUDE: Water Jello Ice Popsicles Tea (sugar ok, no milk/cream) Powdered fruit flavored drinks Coffee (sugar ok, no milk/cream) Gatorade Juice: apple, white grape, white cranberry  Lemonade Clear bullion, consomm, broth Carbonated beverages (any kind) Strained chicken noodle soup Hard Candy                             4.  In the morning, mix first dose of MoviPrep solution:    Empty 1 Pouch A and 1 Pouch B into the disposable container    Add lukewarm drinking water to the top line of the container. Mix to dissolve    Refrigerate (mixed solution should be used within 24 hrs)  5.  Begin drinking the prep at 5:00 p.m. The MoviPrep container is divided by 4 marks.   Every 15 minutes drink the solution down to the next mark (approximately 8 oz) until the full liter is complete.   6.  Follow completed prep with 16 oz of clear liquid of your choice  (Nothing red or purple).  Continue to drink clear liquids until bedtime.  7.  Before going to bed, mix second dose of MoviPrep solution:    Empty 1 Pouch A and 1 Pouch B into the disposable container    Add lukewarm drinking water to the top line of the container. Mix to dissolve    Refrigerate  THE DAY OF YOUR PROCEDURE      DATE: Thursday 7/7  Beginning at 8:30 a.m. (5 hours before procedure):         1. Every 15 minutes, drink the solution down to the next mark (approx 8 oz) until the full liter is complete.  2. Follow completed prep with 16 oz. of clear liquid of your choice.    3. You may drink clear liquids until 11:30 am (2 HOURS BEFORE PROCEDURE).   MEDICATION INSTRUCTIONS           OTHER INSTRUCTIONS  You will need a responsible adult at least 51 years of age to  accompany you and drive you home.   This person must remain in the waiting room during your procedure.  Wear loose fitting clothing that is easily removed.  Leave jewelry and other valuables at home.  However, you may wish to bring a book to read or  an iPod/MP3 player to listen to music as you wait for your procedure to start.  Remove all body piercing jewelry and leave at home.  Total time from sign-in until discharge is approximately 2-3 hours.  You should go home directly after your procedure and rest.  You can resume normal activities the  day after your procedure.  The day of your procedure you should not:   Drive   Make legal decisions   Operate machinery   Drink alcohol   Return to work  You will receive specific instructions about eating, activities and medications before you leave.    The above instructions have been reviewed and explained to me by   Clide Cliff, Rn_______________________    I fully understand and can verbalize these instructions _____________________________ Date _________

## 2010-11-28 NOTE — Progress Notes (Signed)
Summary: sertraline refill  Phone Note Refill Request Call back at Work Phone [phone] Message from:  Patient on October 30, 2010 12:01 PM  Refills Requested: Medication #1:  ZOLOFT 100 MG TABS 1 1/2 by mouth once daily. [facility]         Initial call taken by: Jerolyn Shin,  October 30, 2010 12:02 PM  Follow-up for Phone Call        last seen 02/20/10 and filled 03/26/10.Marland Kitchen Requesting a 3 mo supply. Next appt Jan 10 with Dr. Laury Axon. please advise Follow-up by: Almeta Monas CMA Duncan Dull),  October 30, 2010 2:43 PM  Additional Follow-up for Phone Call Additional follow up Details #1::        ok to refill 3 month supply with 3 refills Additional Follow-up by: Loreen Freud DO,  October 31, 2010 9:49 AM    Additional Follow-up for Phone Call Additional follow up Details #2::    pt made aware.... Almeta Monas CMA (AAMA)  October 31, 2010 11:01 AM   Prescriptions: ZOLOFT 100 MG TABS (SERTRALINE HCL) 1 1/2 by mouth once daily  #135 x 3   Entered by:   Almeta Monas CMA (AAMA)   Authorized by:   Loreen Freud DO   Signed by:   Almeta Monas CMA (AAMA) on 10/31/2010   Method used:   Faxed to ...       [facility] Environmental education officer)       [address] [address]       [place], [place]  [address]       Ph: [phone]       Fax: [phone]   RxID:   [id number]

## 2010-11-28 NOTE — Assessment & Plan Note (Signed)
Summary: lump on back of neck, discuss chol, flu shot, tb shot///sph   Vital Signs:  Patient profile:   50 year old female Weight:      197.8 pounds Pulse rate:   60 / minute BP sitting:   100 / 70  (left arm) Cuff size:   regular  Vitals Entered By: Almeta Monas CMA Duncan Dull) (November 05, 2010 8:17 AM) CC: [id number] c/o Lump on the neck--denies pain// wants TB skin test   History of Present Illness:  Hyperlipidemia follow-up      This is a 50 year old woman who presents for Hyperlipidemia follow-up.  The patient denies muscle aches, GI upset, abdominal pain, flushing, itching, constipation, diarrhea, and fatigue.  The patient denies the following symptoms: chest pain/pressure, exercise intolerance, dypsnea, palpitations, syncope, and pedal edema.  Compliance with medications (by patient report) has been near 100%.  Dietary compliance has been good.  The patient reports exercising occasionally.  Adjunctive measures currently used by the patient include fish oil supplements.    Pt also c/o lump R side neck x 3 months and she still has a cough.   + productive -- white sputum--no fever.     Preventive Screening-Counseling & Management  Alcohol-Tobacco     Alcohol drinks/day: 0     Smoking Status: never  Caffeine-Diet-Exercise     Caffeine use/day: 3     Does Patient Exercise: no     Exercise Counseling: to improve exercise regimen  Current Medications (verified): 1)  Zoloft 100 Mg Tabs (Sertraline Hcl) .Marland Kitchen.. 1 1/2 By Mouth Once Daily 2)  Calcium 600 1500 Mg Tabs (Calcium Carbonate) .Marland Kitchen.. 1 By Mouth Once Daily 3)  Fish Oil 1000 Mg Caps (Omega-3 Fatty Acids) .Marland Kitchen.. 1 By Mouth Once Daily 4)  Red Yeast Rice 600 Mg Caps (Red Yeast Rice Extract) .... 2 By Mouth Once Daily 5)  Augmentin 875-125 Mg Tabs (Amoxicillin-Pot Clavulanate) .Marland Kitchen.. 1 By Mouth Two Times A Day  Allergies (verified): No Known Drug Allergies  Past History:  Past medical, surgical, family and social histories (including  risk factors) reviewed for relevance to current acute and chronic problems.  Past Medical History: Reviewed history from 10/17/2006 and no changes required. Depression GERD IBS  Past Surgical History: Reviewed history from 02/20/2010 and no changes required. Cholecystectomy lithotripsy ---dr Vonita Moss  Family History: Reviewed history from 02/20/2010 and no changes required. Heart disease Family History of Alcoholism/Addiction Family History of Arthritis Family History Breast cancer 1st degree relative <50 Family History Diabetes 1st degree relative Family History High cholesterol Family History of Stroke M 1st degree relative <50  Social History: Reviewed history from 02/20/2010 and no changes required. 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  Teeth in good repair. Neck:  No deformities, masses, or tenderness noted. Lungs:  Normal respiratory effort, chest expands symmetrically. Lungs are clear to auscultation, no crackles or wheezes. Heart:  normal rate and no murmur.   Extremities:  No clubbing, cyanosis, edema, or deformity noted with normal full range of motion of all joints.   Cervical Nodes:  R post node,  non tender   Impression & Recommendations:  Problem # 1:  CERVICAL LYMPHADENOPATHY (ICD-785.6)  Her updated medication list for this problem includes:    Augmentin  875-125 Mg Tabs (Amoxicillin-pot clavulanate) .Marland Kitchen... 1 by mouth two times a day  Orders: TLB-CBC Platelet - w/Differential (85025-CBCD)  Warm, moist compresses and symptomatic measures. Advised patient to complete all antibiotics.  Problem # 2:  HYPERLIPIDEMIA (ICD-272.4)  Orders: Venipuncture  (04540) TLB-BMP (Basic Metabolic Panel-BMET) (80048-METABOL) T- * Misc. Laboratory test [phone])  Labs Reviewed: SGOT: 26 (02/20/2010)   SGPT: 29 (02/20/2010)   HDL:43.80 (02/20/2010)  Chol:221 (02/20/2010)  Trig:240.0 (02/20/2010)  Problem # 3:  COUGH (ICD-786.2) ? allergy ---zyrtec daily pt did not want a nasal spray  Complete Medication List: 1)  Zoloft 100 Mg Tabs (Sertraline hcl) .Marland Kitchen.. 1 1/2 by mouth once daily 2)  Calcium 600 1500 Mg Tabs (Calcium carbonate) .Marland Kitchen.. 1 by mouth once daily 3)  Fish Oil 1000 Mg Caps (Omega-3 fatty acids) .Marland Kitchen.. 1 by mouth once daily 4)  Red Yeast Rice 600 Mg Caps (Red yeast rice extract) .... 2 by mouth once daily 5)  Augmentin 875-125 Mg Tabs (Amoxicillin-pot clavulanate) .Marland Kitchen.. 1 by mouth two times a day  Other Orders: TB Skin Test [phone]) Admin 1st Vaccine (95621) Prescriptions: AUGMENTIN 875-125 MG TABS (AMOXICILLIN-POT CLAVULANATE) 1 by mouth two times a day  #20 x 0   Entered and Authorized by:   Loreen Freud DO   Signed by:   Loreen Freud DO on 11/05/2010   Method used:   [facility] to        [facility] [phone]* (retail)       [address], [place]  [address]       Ph: [phone] or [phone]       Fax: [phone]   RxID:   [id number]    Orders Added: 1)  TB Skin Test [86580] 2)  Admin 1st Vaccine [90471] 3)  Venipuncture [18841] 4)  TLB-BMP (Basic Metabolic Panel-BMET) [80048-METABOL] 5)  T- * Misc. Laboratory test [99999] 6)  TLB-CBC Platelet - w/Differential [85025-CBCD] 7)  Est. Patient Level III [66063]   Immunizations Administered:  PPD Skin Test:    Vaccine Type: PPD    Site: right forearm    Mfr: Sanofi Pasteur    Dose: 0.1 ml    Route: ID    Given by: Almeta Monas CMA (AAMA)    Exp. Date: 05/02/2012    Lot #: [id number]   Immunizations Administered:  PPD Skin Test:    Vaccine Type: PPD    Site: right forearm    Mfr: Sanofi Pasteur    Dose: 0.1 ml    Route: ID    Given by:  Almeta Monas CMA (AAMA)    Exp. Date: 05/02/2012    Lot #: [id number]  Prevention & Chronic Care Immunizations   Influenza vaccine: given  (02/20/2010)   Influenza vaccine due: 08/08/2010    Tetanus booster: 02/20/2010: Tdap    Pneumococcal vaccine: Not documented  Colorectal Screening   Hemoccult: Not documented    Colonoscopy: DONE  (05/02/2010)   Colonoscopy due: 04/2020  Other Screening   Pap smear: refused  (02/20/2010)   Pap smear due: 02/21/2011    Mammogram: Not documented   Smoking status: never  (11/05/2010)  Lipids   Total Cholesterol: 221  (02/20/2010)   LDL: Not documented   LDL Direct: 141.3  (02/20/2010)   HDL: 43.80  (02/20/2010)   Triglycerides: 240.0  (02/20/2010)    SGOT (AST): 26  (02/20/2010)   SGPT (ALT): 29  (02/20/2010)   Alkaline phosphatase: 117  (02/20/2010)  Total bilirubin: 0.5  (02/20/2010)  Self-Management Support :    Lipid self-management support: Not documented

## 2010-11-28 NOTE — Miscellaneous (Signed)
Summary: DIR COL...AS.  Clinical Lists Changes  Medications: Added new medication of MOVIPREP 100 GM  SOLR (PEG-KCL-NACL-NASULF-NA ASC-C) As directed - Signed Rx of MOVIPREP 100 GM  SOLR (PEG-KCL-NACL-NASULF-NA ASC-C) As directed;  #1 x 0;  Signed;  Entered by: Clide Cliff RN;  Authorized by: Hart Carwin MD;  Method used: [facility] [phone]*, [address], [place], [place]  [address], Ph: [phone] or [phone], Fax: [phone] Observations: Added new observation of ALLERGY REV: Done (04/15/2010 7:48)    Prescriptions: MOVIPREP 100 GM  SOLR (PEG-KCL-NACL-NASULF-NA ASC-C) As directed  #1 x 0   Entered by:   Clide Cliff RN   Authorized by:   Hart Carwin MD   Signed by:   Clide Cliff RN on 04/15/2010   Method used:   [facility]ronically to        [facility] [phone]* (retail)       [address]       [place], [place]  [address]       Ph: [phone] or [phone]       Fax: [phone]   RxID:   [id number]

## 2010-11-28 NOTE — Progress Notes (Signed)
Summary: Refill Request  Phone Note Refill Request Call back at [phone] Message from:  Pharmacy on Mar 26, 2010 12:40 PM  Refills Requested: Medication #1:  ZOLOFT 100 MG TABS 1 1/2 by mouth once daily.   Dosage confirmed as above?Dosage Confirmed   Brand Name Necessary? No   Supply Requested: 3 months   Notes: 4 refills [facility]  Next Appointment Scheduled: none Initial call taken by: Harold Barban,  Mar 26, 2010 12:40 PM    Prescriptions: ZOLOFT 100 MG TABS (SERTRALINE HCL) 1 1/2 by mouth once daily  #135 x 1   Entered by:   Army Fossa CMA   Authorized by:   Loreen Freud DO   Signed by:   Army Fossa CMA on 03/26/2010   Method used:   Faxed to ...       [facility] Environmental education officer)       [address] [address]       [place], [place]  [address]       Ph: [phone]       Fax: [phone]   RxID:   [id number]

## 2010-11-28 NOTE — Progress Notes (Signed)
Summary: [place] Results   Phone Note Outgoing Call   Call placed by: Army Fossa CMA,  Mar 19, 2010 10:07 AM Reason for Call: Discuss lab or test results Summary of Call: Regarding [place], LMTCB:  Pelvic US: Normal Transvaginal US: Normal  Follow-up for Phone Call        [facility]. Army Fossa CMA  Mar 20, 2010 10:22 AM   Additional Follow-up for Phone Call Additional follow up Details #1::        Patient aware of results Additional Follow-up by: Shonna Chock,  Mar 20, 2010 4:13 PM

## 2010-11-28 NOTE — Miscellaneous (Signed)
Summary: Immunization Entry    PPD Results    Date of reading: 11/07/2010    Results: < 5mm    Interpretation: negative 

## 2010-11-28 NOTE — Procedures (Signed)
Summary: Colonoscopy  Patient: [name] Note: All result statuses are Final unless otherwise noted.  Tests: (1) Colonoscopy (COL)   COL Colonoscopy           DONE     [facility]     [address]     [place], [place]  [address]           COLONOSCOPY PROCEDURE REPORT           PATIENT:  Sydney Thompson, Sydney Thompson  MR#:  [id number]     BIRTHDATE:  1960-12-28, 49 yrs. old  GENDER:  female     ENDOSCOPIST:  Hedwig Morton. Juanda Chance, MD     REF. BY:  Loreen Freud, DO     PROCEDURE DATE:  05/02/2010     PROCEDURE:  Colonoscopy [address]     ASA CLASS:  Class II     INDICATIONS:  Routine Risk Screening     MEDICATIONS:   Versed 6 mg, Fentanyl 50 mcg           DESCRIPTION OF PROCEDURE:   After the risks benefits and     alternatives of the procedure were thoroughly explained, informed     consent was obtained.  Digital rectal exam was performed and     revealed no rectal masses.   The Pentax Ped Colon [id number]     endoscope was introduced through the anus and advanced to the     cecum, which was identified by both the appendix and ileocecal     valve, without limitations.  The quality of the prep was good,     using MiraLax.  The instrument was then slowly withdrawn as the     colon was fully examined.     <<PROCEDUREIMAGES>>           FINDINGS:  Internal hemorrhoids were found (see image5).  This was     otherwise a normal examination of the colon (see image4, image3,     image2, and image1).   Retroflexed views in the rectum revealed no     abnormalities.    The scope was then withdrawn from the patient     and the procedure completed.           COMPLICATIONS:  None     ENDOSCOPIC IMPRESSION:     1) Internal hemorrhoids     2) Otherwise normal examination     RECOMMENDATIONS:     1) high fiber diet     REPEAT EXAM:  In 10 year(s) for.           ______________________________     Hedwig Morton. Juanda Chance, MD           CC:           n.     eSIGNED:   Hedwig Morton. Lianah Peed at 05/02/2010 01:59  PM           Janit Bern, [phone]  Note: An exclamation mark (!) indicates a result that was not dispersed into the flowsheet. Document Creation Date: 05/02/2010 1:59 PM _______________________________________________________________________  (1) Order result status: Final Collection or observation date-time: 05/02/2010 13:52 Requested date-time:  Receipt date-time:  Reported date-time:  Referring Physician:   Ordering Physician: Lina Sar [phone]) Specimen Source:  Source: Launa Grill Order Number: [id number] Lab site:   Appended Document: Colonoscopy    Clinical Lists Changes  Observations: Added new observation of COLONNXTDUE: 04/2020 (05/02/2010 14:06)

## 2010-11-29 NOTE — Consult Note (Signed)
Summary: [facility]   Imported By: Lanelle Bal 04/11/2010 08:47:17  _____________________________________________________________________  External Attachment:    Type:   Image     Comment:   External Document

## 2010-12-04 NOTE — Assessment & Plan Note (Signed)
Summary: DISCUSS LAB WORK//[facility]   Vital Signs:  Patient profile:   50 year old female Height:      62 inches Weight:      200.2 pounds BMI:     36.75 Pulse rate:   60 / minute Pulse rhythm:   regular BP sitting:   104 / 62  (left arm) Cuff size:   large  Vitals Entered By: Almeta Monas CMA Duncan Dull) (November 26, 2010 8:31 AM) CC: Review [facility]   History of Present Illness: Pt here to review labs. She also c/o CTS b/l --- pt has splints but has not worn them.   She is also requesting sleep evaluation because she snores.  She has never been told she stopped breathing however.    Current Medications (verified): 1)  Zoloft 100 Mg Tabs (Sertraline Hcl) .Marland Kitchen.. 1 1/2 By Mouth Once Daily 2)  Calcium 600 1500 Mg Tabs (Calcium Carbonate) .Marland Kitchen.. 1 By Mouth Once Daily 3)  Fish Oil 1000 Mg Caps (Omega-3 Fatty Acids) .Marland Kitchen.. 1 By Mouth Once Daily 4)  Red Yeast Rice 600 Mg Caps (Red Yeast Rice Extract) .... 2 By Mouth Once Daily 5)  Crestor 10 Mg Tabs (Rosuvastatin Calcium) .Marland Kitchen.. 1 By Mouth At Bedtime  Allergies (verified): No Known Drug Allergies  Past History:  Past Medical History: Last updated: 10/17/2006 Depression GERD IBS  Past Surgical History: Last updated: 02/20/2010 Cholecystectomy lithotripsy ---dr Vonita Moss  Family History: Last updated: 02/20/2010 Heart disease Family History of Alcoholism/Addiction Family History of Arthritis Family History Breast cancer 1st degree relative <50 Family History Diabetes 1st degree relative Family History High cholesterol Family History of Stroke M 1st degree relative <50  Social History: Last updated: 02/20/2010 Single Never Smoked Alcohol use-no Regular exercise-no 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 Heart disease Family History of Alcoholism/Addiction Family History of Arthritis Family History Breast cancer 1st degree relative <50 Family History Diabetes 1st degree relative Family History High cholesterol Family History of Stroke M 1st degree relative <50  Social History: Reviewed history from 02/20/2010 and no changes required. 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 Lungs are clear to auscultation, no crackles or wheezes. Msk:  numbness and tingling both hands Psych:  Oriented X3 and normally interactive.     Impression & Recommendations:  Problem # 1:  HYPERLIPIDEMIA (ICD-272.4)  Her updated medication list for this problem includes:    Crestor 10 Mg Tabs (Rosuvastatin calcium) .Marland Kitchen... 1 by mouth at bedtime  Labs Reviewed: SGOT: 26 (02/20/2010)   SGPT: 29 (02/20/2010)   HDL:43.80 (02/20/2010)  Chol:221 (02/20/2010)  Trig:240.0 (02/20/2010)  Problem # 2:  SNORING (ICD-786.09)  Orders: Sleep Disorder Referral (Sleep Disorder)  Problem # 3:  CARPAL TUNNEL SYNDROME, BILATERAL (ICD-354.0)  wear splints refer to hand surgeon if no better  Labs Reviewed: TSH: 2.53 (02/20/2010)   HgBA1c: 5.7 (02/20/2010)  Complete Medication List: 1)  Zoloft 100 Mg Tabs (Sertraline hcl) .Marland Kitchen.. 1 1/2 by mouth once daily 2)  Calcium 600 1500 Mg Tabs (Calcium carbonate) .Marland Kitchen.. 1 by mouth once daily 3)  Fish Oil  1000 Mg Caps (Omega-3 fatty acids) .Marland Kitchen.. 1 by mouth once daily 4)  Red Yeast Rice 600 Mg Caps (Red yeast rice extract) .... 2 by mouth once daily 5)  Crestor 10 Mg Tabs (Rosuvastatin calcium) .Marland Kitchen.. 1 by mouth at bedtime  Patient Instructions: 1)  fasting labs in 3  months---272.4   lipid, hep, crp Prescriptions: CRESTOR 10 MG TABS (ROSUVASTATIN CALCIUM) 1 by mouth at bedtime  #30 x 2   Entered and Authorized by:   Loreen Freud DO   Signed by:   Loreen Freud DO on 11/26/2010   Method used:   Print then Give to Patient   RxID:   [id number]    Orders Added: 1)  Sleep Disorder Referral [Sleep Disorder] 2)  Est. Patient Level III [[address]]

## 2010-12-19 ENCOUNTER — Encounter: Payer: Self-pay | Admitting: Pulmonary Disease

## 2010-12-19 ENCOUNTER — Institutional Professional Consult (permissible substitution) (INDEPENDENT_AMBULATORY_CARE_PROVIDER_SITE_OTHER): Payer: BC Managed Care – PPO | Admitting: Pulmonary Disease

## 2010-12-19 DIAGNOSIS — R0989 Other specified symptoms and signs involving the circulatory and respiratory systems: Secondary | ICD-10-CM

## 2011-01-02 NOTE — Assessment & Plan Note (Signed)
Summary: snoring //kp   Visit Type:  Initial Consult Copy to:  pcp Primary Provider/Referring Provider:  Laury Axon  CC:  Sleep consult. Pt c/o "loud snoring" and hand jerking.  History of Present Illness: 50/F  for evaluation of loud snoring. I sleep deep, tired in am, hands twitch, sleeps on her side x 2 pillows Epworth Sleepiness Score 5.Takes  2 h naps  bedtime 11-12 MN, sleep latency upto 30 m ins, 1 BR visit, oob 0830 on workdays, sleeps more on weekends but not refershing, witnessed apneas 2 yrs ago when she was with a friend in a hotel room. There is no history suggestive of cataplexy, sleep paralysis or parasomnias  She has not tried any treatments , no aggravating factors  Preventive Screening-Counseling & Management  Alcohol-Tobacco     Alcohol drinks/day: 0     Smoking Status: never   History of Present Illness: loud snoring  What time do you typically go to bed?(between what hours): 11-12  How long does it take you to fall asleep? sometimes immediately to 1/2 hr  How many times during the night do you wake up? once  What time do you get out of bed to start your day? 8:30 when I work  Do you drive or operate heavy machinery in your occupation? n/a  How much has your weight changed (up or down) over the past two years? (in pounds): gained 10  Have you ever had a sleep study before?  If yes,when and where: no  Do you currently use CPAP ? If so , at what pressure? no  Do you wear oxygen at any time? If yes, how many liters per minute? no Current Medications (verified): 1)  Zoloft 100 Mg Tabs (Sertraline Hcl) .Marland Kitchen.. 1 1/2 By Mouth Once Daily 2)  Calcium 600 1500 Mg Tabs (Calcium Carbonate) .Marland Kitchen.. 1 By Mouth Once Daily 3)  Fish Oil 1000 Mg Caps (Omega-3 Fatty Acids) .Marland Kitchen.. 1 By Mouth Once Daily 4)  Crestor 10 Mg Tabs (Rosuvastatin Calcium) .Marland Kitchen.. 1 By Mouth At Bedtime  Allergies (verified): No Known Drug Allergies  Past History:  Past Medical History: Last updated:  10/17/2006 Depression GERD IBS  Past Surgical History: Last updated: 02/20/2010 Cholecystectomy lithotripsy ---dr Vonita Moss  Family History: Last updated: 02/20/2010 Heart disease Family History of Alcoholism/Addiction Family History of Arthritis Family History Breast cancer 1st degree relative <50 Family History Diabetes 1st degree relative Family History High cholesterol Family History of Stroke M 1st degree relative <50  Social History: Last updated: 02/20/2010 Single Never Smoked Alcohol use-no Regular exercise-no 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  The patient denies shortness of breath with activity, shortness of breath at rest, coughing up blood, chest pain, irregular heartbeats, acid heartburn, indigestion, loss of appetite, abdominal pain, difficulty swallowing, sore throat, tooth/dental problems, headaches, nasal congestion/difficulty breathing through nose, sneezing, itching, ear ache, anxiety, depression, hand/feet swelling, joint stiffness or pain, rash, change in color of mucus, and fever.    Vital Signs:  Patient profile:   50 year old female Height:      62 inches Weight:      202 pounds BMI:     37.08 O2 Sat:      96 % on Room air Temp:     97.2 degrees F oral Pulse rate:   92 / minute BP sitting:   126 / 74  (left  arm) Cuff size:   regular  Vitals Entered By: Zackery Barefoot CMA (December 19, 2010 9:08 AM)  O2 Flow:  Room air CC: Sleep consult. Pt c/o "loud snoring" and hand jerking Comments Medications reviewed with patient Verified contact number and pharmacy with patient Zackery Barefoot CMA  December 19, 2010 9:09 AM    Physical Exam  Additional Exam:  Gen. Pleasant, well-nourished, in no distress, normal affect ENT - no lesions, no post nasal drip, class 2 airwy Neck: No JVD, no thyromegaly, no carotid bruits Lungs: no  use of accessory muscles, no dullness to percussion, clear without rales or rhonchi  Cardiovascular: Rhythm regular, heart sounds  normal, no murmurs or gallops, no peripheral edema Abdomen: soft and non-tender, no hepatosplenomegaly, BS normal. Musculoskeletal: No deformities, no cyanosis or clubbing Neuro:  alert, non focal     Impression & Recommendations:  Problem # 1:  SNORING (ICD-786.09) Loud snoring, excessive daytime somnolence & witnessed apneas - pre test probability of obstructive sleep apnea is high The pathophysiology of obstructive sleep apnea, it's cardiovascular consequences and modes of treatment including CPAP were discussed with the patient in great detail.  Orders: Consultation Level III (16109) Sleep Study (Sleep Study)  Patient Instructions: 1)  Copy sent to:Dr Lowne 2)  Please schedule a follow-up appointment in 2 weeks after study

## 2011-01-14 ENCOUNTER — Other Ambulatory Visit: Payer: Self-pay | Admitting: Family Medicine

## 2011-01-14 MED ORDER — ROSUVASTATIN CALCIUM 10 MG PO TABS: 10.0000 mg | ORAL_TABLET | Freq: Every day | ORAL | Status: DC

## 2011-01-20 ENCOUNTER — Ambulatory Visit (HOSPITAL_BASED_OUTPATIENT_CLINIC_OR_DEPARTMENT_OTHER): Payer: BC Managed Care – PPO | Attending: Pulmonary Disease

## 2011-01-20 DIAGNOSIS — G4733 Obstructive sleep apnea (adult) (pediatric): Secondary | ICD-10-CM | POA: Insufficient documentation

## 2011-01-23 DIAGNOSIS — G4733 Obstructive sleep apnea (adult) (pediatric): Secondary | ICD-10-CM

## 2011-01-29 ENCOUNTER — Telehealth: Payer: Self-pay | Admitting: Pulmonary Disease

## 2011-01-29 NOTE — Telephone Encounter (Signed)
Patient phoned stated that she just missed a call from our office. She can be reached at [phone] .Sydney Thompson

## 2011-01-29 NOTE — Telephone Encounter (Signed)
Pt requesting sleep study results. Please advise Dr. Vassie Loll thanks

## 2011-01-31 LAB — COMPREHENSIVE METABOLIC PANEL
ALT: 120 U/L — ABNORMAL HIGH (ref 0–35)
AST: 31 U/L (ref 0–37)
Alkaline Phosphatase: 109 U/L (ref 39–117)
CO2: 29 mEq/L (ref 19–32)
CO2: 29 mEq/L (ref 19–32)
Calcium: 9.5 mg/dL (ref 8.4–10.5)
Chloride: 105 mEq/L (ref 96–112)
Creatinine, Ser: 0.71 mg/dL (ref 0.4–1.2)
GFR calc Af Amer: >60 mL/min (ref >60)
GFR calc non Af Amer: >60 mL/min (ref >60)
Glucose, Bld: 103 mg/dL — ABNORMAL HIGH (ref 70–99)
Glucose, Bld: 106 mg/dL — ABNORMAL HIGH (ref 70–99)
Total Bilirubin: 0.6 mg/dL (ref 0.3–1.2)
Total Bilirubin: 0.8 mg/dL (ref 0.3–1.2)
Total Protein: 7.2 g/dL (ref 6.0–8.3)

## 2011-01-31 LAB — HEPATIC FUNCTION PANEL
ALT: 112 U/L — ABNORMAL HIGH (ref 0–35)
ALT: 86 U/L — ABNORMAL HIGH (ref 0–35)
Albumin: 3.3 g/dL — ABNORMAL LOW (ref 3.5–5.2)
Alkaline Phosphatase: 107 U/L (ref 39–117)
Indirect Bilirubin: 0.4 mg/dL (ref 0.3–0.9)
Total Bilirubin: 0.4 mg/dL (ref 0.3–1.2)
Total Bilirubin: 0.6 mg/dL (ref 0.3–1.2)
Total Protein: 6 g/dL (ref 6.0–8.3)
Total Protein: 6.8 g/dL (ref 6.0–8.3)

## 2011-01-31 LAB — DIFFERENTIAL
Basophils Relative: 1 % (ref 0–1)
Basophils Relative: 3 % — ABNORMAL HIGH (ref 0–1)
Lymphocytes Relative: 12 % (ref 12–46)
Lymphocytes Relative: 23 % (ref 12–46)
Monocytes Relative: 7 % (ref 3–12)
Neutro Abs: 8.2 10*3/uL — ABNORMAL HIGH (ref 1.7–7.7)

## 2011-01-31 LAB — LIPASE, BLOOD: Lipase: 16 U/L (ref 11–59)

## 2011-01-31 LAB — CBC
HCT: 35.7 % — ABNORMAL LOW (ref 36.0–46.0)
HCT: 37 % (ref 36.0–46.0)
Hemoglobin: 12 g/dL (ref 12.0–15.0)
RBC: 4.01 MIL/uL (ref 3.87–5.11)
WBC: 9 10*3/uL (ref 4.0–10.5)

## 2011-01-31 LAB — PROTIME-INR
INR: 1 (ref 0.00–1.49)
Prothrombin Time: 12.9 seconds (ref 11.6–15.2)

## 2011-02-03 ENCOUNTER — Telehealth: Payer: Self-pay | Admitting: Pulmonary Disease

## 2011-02-03 DIAGNOSIS — G4733 Obstructive sleep apnea (adult) (pediatric): Secondary | ICD-10-CM

## 2011-02-03 NOTE — Telephone Encounter (Signed)
Pl let her know that PSG showed obstructive sleep apnea where she stopped breathing 35 times per hour & her O2 level dropped. This ws corrected by CPAP If she is agreebale , can start CPAP If OK, write order for  CPAP 1 cm, small full face mask, humidity, download in 4 weeks

## 2011-02-04 NOTE — Telephone Encounter (Signed)
Read - cpap 10 cm

## 2011-02-05 ENCOUNTER — Telehealth: Payer: Self-pay | Admitting: Pulmonary Disease

## 2011-02-05 NOTE — Telephone Encounter (Signed)
NO

## 2011-02-05 NOTE — Telephone Encounter (Signed)
10 cm 

## 2011-02-05 NOTE — Telephone Encounter (Signed)
Pt wants to know if her hand movements/jerking are due to her sleep apnea. Dis her test mention her hand movements? Pls advise.

## 2011-02-05 NOTE — Telephone Encounter (Signed)
Informed pt of RA recs, pt agrees to start CPAP.  Dr. Vassie Loll please verify setting before I send in the order. Thanks.

## 2011-02-06 ENCOUNTER — Encounter: Payer: Self-pay | Admitting: Pulmonary Disease

## 2011-02-06 NOTE — Telephone Encounter (Signed)
lmomtcb x1  Mindy Silva, CMA  

## 2011-02-06 NOTE — Telephone Encounter (Signed)
Pt returned call from triage. Kathleen Perdue  °

## 2011-02-06 NOTE — Telephone Encounter (Signed)
Spoke w/ pt and advised her to give cpap a try to see if this corrects it or not. Pt verbalized understanding

## 2011-02-06 NOTE — Telephone Encounter (Signed)
I do not have an explanation Let us see if CPAP corrects this.

## 2011-02-06 NOTE — Telephone Encounter (Signed)
Pt states when she falls asleep she her hands jerk. Pt states she doesn't understand why it does that then. Pt states she has informed Dr. Vassie Loll of this before but has never addressed this. Please advise Dr. Vassie Loll. Thanks  Sydney Thompson, CMA

## 2011-02-07 ENCOUNTER — Telehealth: Payer: Self-pay | Admitting: *Deleted

## 2011-02-07 NOTE — Telephone Encounter (Signed)
Its a good screening tool if she has the extra money to have it done

## 2011-02-07 NOTE — Telephone Encounter (Signed)
Pt aware.

## 2011-02-07 NOTE — Telephone Encounter (Signed)
Pt states that she received a form in mail from [facility] for screening test that cost $169. The screening is suppose to check for risk of stroke, heart abnormality/rhythm, chronic kidney disease, abdominal aorta aneurysm , osteoporosis, arteries, and peripheral arteries. Pt would like to know if dr Laury Axon thinks this screening is worth doing. Please advise

## 2011-02-17 NOTE — Telephone Encounter (Signed)
I did not call her Sleep study results have been given. Pl see earlier phone notes

## 2011-02-17 NOTE — Telephone Encounter (Signed)
Dr. Alva please advise. Thanks. 

## 2011-02-24 ENCOUNTER — Other Ambulatory Visit: Payer: Self-pay

## 2011-02-24 NOTE — Telephone Encounter (Signed)
Looks like there was a duplicate phone message on this.  Per phone message from 02/03/11, pt was informed of results and was in agreement to start cpap.  LMOMTCB to see if anything further is needed regarding this.

## 2011-02-25 ENCOUNTER — Other Ambulatory Visit: Payer: Self-pay

## 2011-02-25 ENCOUNTER — Telehealth: Payer: Self-pay | Admitting: Pulmonary Disease

## 2011-02-25 NOTE — Telephone Encounter (Signed)
Unsure who called pt.  I don't see any documentation in her chart that triage called her recently.  Last phone note from 02/05/2011 was signed off and completed.  ATC pt to get more info.  NA and unable to leave message.  WCB.

## 2011-02-26 NOTE — Telephone Encounter (Signed)
lmomtcb  

## 2011-02-27 ENCOUNTER — Other Ambulatory Visit (INDEPENDENT_AMBULATORY_CARE_PROVIDER_SITE_OTHER): Payer: BC Managed Care – PPO

## 2011-02-27 DIAGNOSIS — E785 Hyperlipidemia, unspecified: Secondary | ICD-10-CM

## 2011-02-27 LAB — HEPATIC FUNCTION PANEL
AST: 33 U/L (ref 0–37)
Albumin: 3.7 g/dL (ref 3.5–5.2)
Alkaline Phosphatase: 84 U/L (ref 39–117)
Total Protein: 7 g/dL (ref 6.0–8.3)

## 2011-02-27 LAB — LIPID PANEL
Cholesterol: 147 mg/dL (ref 0–200)
HDL: 43.3 mg/dL (ref >39.00)
Triglycerides: 137 mg/dL (ref 0.0–149.0)

## 2011-02-27 NOTE — Telephone Encounter (Signed)
lmomtcb x 2  

## 2011-02-28 LAB — C-REACTIVE PROTEIN: CRP: 1.2 mg/dL — ABNORMAL HIGH (ref <0.6)

## 2011-02-28 NOTE — Telephone Encounter (Signed)
lmomtcb x 3  

## 2011-03-03 NOTE — Telephone Encounter (Signed)
We have attempted to contact pt and LMOMTCB x 3.  Per protocol, will sign off on this message and wait for pt to return our messages.

## 2011-04-04 ENCOUNTER — Telehealth: Payer: Self-pay | Admitting: *Deleted

## 2011-04-04 NOTE — Telephone Encounter (Signed)
Phone call complete

## 2011-04-08 ENCOUNTER — Telehealth: Payer: Self-pay | Admitting: Pulmonary Disease

## 2011-04-08 NOTE — Telephone Encounter (Signed)
I can see her to review sleep study if I have morning appt available.  Please explain to her that she would then need to follow up with Dr. Vassie Loll afterward.

## 2011-04-08 NOTE — Telephone Encounter (Signed)
thx Vineet

## 2011-04-08 NOTE — Telephone Encounter (Signed)
Called and spoke with pt.  Pt is aware of RA's response.  Pt states she does want to come in for a visit to stay in compliance with insurance requirements but can only come in on Tues, Weds, and Friday Mornings and you're schedule for June and July are only PM appts. RA, do you have any recs or ideas on how we can help this pt and get her in for an appt. Thanks.

## 2011-04-08 NOTE — Telephone Encounter (Signed)
Called and spoke with pt.  Pt states she was told to f/u with RA after having her sleep study.  Pt states she is only able to come to the office on Tuesdays, Wednesdays and Fridays in the mornings.  The only appts RA has avail for the months of June and July are PM appts (august schedule isn't out yet)  Pt wanted to know if RA would be able to just discuss things with her over the phone with her until she is able to schedule an appt.  Please advise.  Thank you.

## 2011-04-08 NOTE — Telephone Encounter (Signed)
Will forward message to [facility] and [facility] to see if they would be willing to see pt for Dr. Vassie Thompson.

## 2011-04-08 NOTE — Telephone Encounter (Signed)
Pl ask if one of my partners - [facility] or [facility] can see this pt  For this purpose.

## 2011-04-08 NOTE — Telephone Encounter (Signed)
I can discuss  - but medicare requires a face to face meeting - otherwise they will not pay for her CPAP

## 2011-04-09 NOTE — Telephone Encounter (Signed)
Will forward to [facility] to see if he is kay with seeing pt to review these results, since [facility] has no am appts avaialble June or July. Pls advise, thanks!

## 2011-04-14 NOTE — Telephone Encounter (Signed)
[email or web address].Vanita Cannell, CMA  

## 2011-04-14 NOTE — Telephone Encounter (Signed)
Scheduled appt w/Dr. Shelle Iron 7/3 @ 10:15am.  Patient aware.

## 2011-04-14 NOTE — Telephone Encounter (Signed)
Ok with me 

## 2011-04-28 ENCOUNTER — Encounter: Payer: Self-pay | Admitting: Pulmonary Disease

## 2011-04-29 ENCOUNTER — Ambulatory Visit (INDEPENDENT_AMBULATORY_CARE_PROVIDER_SITE_OTHER): Payer: BC Managed Care – PPO | Admitting: Pulmonary Disease

## 2011-04-29 ENCOUNTER — Encounter: Payer: Self-pay | Admitting: Pulmonary Disease

## 2011-04-29 VITALS — BP 116/62 | HR 80 | Temp 97.8°F | Ht 62.0 in | Wt 217.2 lb

## 2011-04-29 DIAGNOSIS — G4733 Obstructive sleep apnea (adult) (pediatric): Secondary | ICD-10-CM

## 2011-04-29 NOTE — Patient Instructions (Signed)
Will have your cpap increased to 12cm, which is your optimal level based on sleep study Work on weight loss followup with Dr. Vassie Loll in 6mos

## 2011-04-29 NOTE — Progress Notes (Signed)
  Subjective:    Patient ID: Sydney Thompson, female    DOB: 11-Aug-1961, 50 y.o.   MRN: [id number]  HPI The pt comes in today for f/u of her known moderate osa.  She has been wearing cpap compliantly, and feels she has seen a significant improvement in her sleep.  She denies any issues with pressure or mask fit.     Review of Systems  Constitutional: Negative for fever and unexpected weight change.  HENT: Positive for congestion, rhinorrhea and postnasal drip. Negative for ear pain, nosebleeds, sore throat, sneezing, trouble swallowing, dental problem and sinus pressure.   Eyes: Negative for redness and itching.  Respiratory: Positive for cough. Negative for chest tightness, shortness of breath and wheezing.   Cardiovascular: Negative for palpitations and leg swelling.  Gastrointestinal: Negative for nausea and vomiting.  Genitourinary: Negative for dysuria.  Musculoskeletal: Negative for joint swelling.  Skin: Negative for rash.  Neurological: Negative for headaches.  Hematological: Does not bruise/bleed easily.  Psychiatric/Behavioral: Negative for dysphoric mood. The patient is not nervous/anxious.        Objective:   Physical Exam Ow female in nad No skin breakdown or pressure necrosis from cpap mask LE without significant edema, no cyanosis noted. Alert, does not appear sleepy, moves all 4        Assessment & Plan:

## 2011-04-29 NOTE — Assessment & Plan Note (Signed)
The pt is doing well with cpap, and reports definite improvement in her sleep and daytime alertness.  She is having no mask or pressure issues.  I will have her pressure increased to her optimal level of 12cm, and she is to followup with Dr. Vassie Loll in 6mos.  I have also encouraged her to work on weight loss, and to call if having issues with cpap/sleep.

## 2011-06-03 ENCOUNTER — Telehealth: Payer: Self-pay | Admitting: Pulmonary Disease

## 2011-06-03 ENCOUNTER — Other Ambulatory Visit: Payer: Self-pay | Admitting: Family Medicine

## 2011-06-03 DIAGNOSIS — G4733 Obstructive sleep apnea (adult) (pediatric): Secondary | ICD-10-CM

## 2011-06-03 NOTE — Telephone Encounter (Signed)
Order has been sent to [facility]  lmomtcb to advise pt

## 2011-06-03 NOTE — Telephone Encounter (Signed)
Pt aware order was sent to decrease pressure to 10 cm. Pt verbalized understanding

## 2011-06-03 NOTE — Telephone Encounter (Signed)
Spoke with pt and she states since her cpap pressure was changed to 12 she is not able to wear her cpap all night. Pt states theirs nights she is only able to wear her cpap x 1 hr. Pt states it is to much air blowing out in the mask and it wakes her up. Pt states when she was set at 10 she was able to wear her cpap all night and wants to go back to that setting. Please advise Dr. Vassie Loll. Thanks  Carver Fila, CMA

## 2011-06-03 NOTE — Telephone Encounter (Signed)
Ok to decrease to 10 cm again

## 2011-06-03 NOTE — Telephone Encounter (Signed)
Pt return call. Krista L Lilly  °

## 2011-10-20 ENCOUNTER — Encounter: Payer: Self-pay | Admitting: Family Medicine

## 2011-10-30 ENCOUNTER — Ambulatory Visit (INDEPENDENT_AMBULATORY_CARE_PROVIDER_SITE_OTHER): Payer: BC Managed Care – PPO

## 2011-10-30 DIAGNOSIS — Z1329 Encounter for screening for other suspected endocrine disorder: Secondary | ICD-10-CM

## 2011-11-02 ENCOUNTER — Encounter (INDEPENDENT_AMBULATORY_CARE_PROVIDER_SITE_OTHER): Payer: BC Managed Care – PPO

## 2011-11-02 DIAGNOSIS — R059 Cough, unspecified: Secondary | ICD-10-CM

## 2011-11-02 DIAGNOSIS — N390 Urinary tract infection, site not specified: Secondary | ICD-10-CM

## 2011-11-02 DIAGNOSIS — J040 Acute laryngitis: Secondary | ICD-10-CM

## 2011-11-02 DIAGNOSIS — R05 Cough: Secondary | ICD-10-CM

## 2011-11-18 ENCOUNTER — Ambulatory Visit (INDEPENDENT_AMBULATORY_CARE_PROVIDER_SITE_OTHER): Payer: Self-pay | Admitting: Family Medicine

## 2011-11-18 ENCOUNTER — Encounter: Payer: Self-pay | Admitting: Family Medicine

## 2011-11-18 VITALS — BP 112/66 | HR 72 | Temp 98.7°F | Ht 62.0 in | Wt 223.6 lb

## 2011-11-18 DIAGNOSIS — L409 Psoriasis, unspecified: Secondary | ICD-10-CM

## 2011-11-18 DIAGNOSIS — Z0000 Encounter for general adult medical examination without abnormal findings: Secondary | ICD-10-CM

## 2011-11-18 DIAGNOSIS — L408 Other psoriasis: Secondary | ICD-10-CM

## 2011-11-18 DIAGNOSIS — E785 Hyperlipidemia, unspecified: Secondary | ICD-10-CM

## 2011-11-18 DIAGNOSIS — F329 Major depressive disorder, single episode, unspecified: Secondary | ICD-10-CM

## 2011-11-18 DIAGNOSIS — F3289 Other specified depressive episodes: Secondary | ICD-10-CM

## 2011-11-18 MED ORDER — CLOBETASOL PROPIONATE 0.05 % EX FOAM: Freq: Two times a day (BID) | CUTANEOUS | Status: DC

## 2011-11-18 NOTE — Progress Notes (Signed)
  Subjective:     Sydney Thompson is a 51 y.o. female and is here for a comprehensive physical exam. The patient reports no problems.  History   Social History  . Marital Status: Single    Spouse Name: N/A    Number of Children: N/A  . Years of Education: N/A   Occupational History  . 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 Smoking status: Never Smoker   . Smokeless tobacco: Not on file  . Alcohol Use: Not on file  . Drug Use: Not on file  . Sexually Active: Yes -- Female partner(s)   Other Topics Concern  . Not on file   Social History Narrative   Exercise-- no   Health Maintenance  Topic Date Due  . Influenza Vaccine  07/28/2011  . Mammogram  09/22/2013  . Pap Smear  11/17/2014  . Colonoscopy  11/18/2019  . Tetanus/tdap  02/21/2020    The following portions of the patient's history were reviewed and updated as appropriate: allergies, current medications, past family history, past medical history, past social history, past surgical history and problem list.  Review of Systems Review of Systems  Constitutional: Negative for activity change, appetite change and fatigue.  HENT: Negative for hearing loss, congestion, tinnitus and ear discharge.  dentist q41m Eyes: Negative for visual disturbance (see optho q1y -- vision corrected to 20/20 with glasses).  Respiratory: Negative for cough, chest tightness and shortness of breath.   Cardiovascular: Negative for chest pain, palpitations and leg swelling.  Gastrointestinal: Negative for abdominal pain, diarrhea, constipation and abdominal distention.  Genitourinary: Negative for urgency, frequency, decreased urine volume and difficulty urinating.  Musculoskeletal: Negative for back pain, arthralgias and gait problem.  Skin: Negative for color change, pallor and rash.  Neurological: Negative for dizziness, light-headedness, numbness and headaches.  Hematological: Negative for adenopathy. Does not  bruise/bleed easily.  Psychiatric/Behavioral: Negative for suicidal ideas, confusion, sleep disturbance, self-injury, dysphoric mood, decreased concentration and agitation.       Objective:  BP 112/66  Pulse 72  Temp(Src) 98.7 F (37.1 C) (Oral)  Ht 5\' 2"  (1.575 m)  Wt 223 lb 9.6 oz (101.424 kg)  BMI 40.90 kg/m2  SpO2 97% General appearance: alert, cooperative, appears stated age and no distress Head: Normocephalic, without obvious abnormality, atraumatic Eyes: conjunctivae/corneas clear. PERRL, EOM's intact. Fundi benign. Ears: normal TM's and external ear canals both ears Nose: Nares normal. Septum midline. Mucosa normal. No drainage or sinus tenderness. Throat: lips, mucosa, and tongue normal; teeth and gums normal Neck: no adenopathy, supple, symmetrical, trachea midline and thyroid not enlarged, symmetric, no tenderness/mass/nodules Back: symmetric, no curvature. ROM normal. No CVA tenderness. Lungs: clear to auscultation bilaterally Breasts: no masses, no nipple d/c or dimpling, no axillary nodes Heart: regular rate and rhythm, S1, S2 normal, no murmur, click, rub or gallop Abdomen: soft, non-tender; bowel sounds normal; no masses,  no organomegaly Pelvic: na Extremities: extremities normal, atraumatic, no cyanosis or edema Pulses: 2+ and symmetric Skin: Skin color, texture, turgor normal. No rashes or lesions Lymph nodes: Cervical, supraclavicular, and axillary nodes normal. Neurologic: Grossly normal      Assessment:    Healthy female exam.      Plan:    ghm utd Check labs See After Visit Summary for Counseling Recommendations

## 2011-11-18 NOTE — Assessment & Plan Note (Signed)
con't meds stable 

## 2011-11-18 NOTE — Assessment & Plan Note (Signed)
Check labs con't meds 

## 2011-11-18 NOTE — Patient Instructions (Signed)

## 2011-11-21 ENCOUNTER — Other Ambulatory Visit: Payer: BC Managed Care – PPO

## 2012-03-22 ENCOUNTER — Other Ambulatory Visit: Payer: Self-pay | Admitting: Family Medicine

## 2012-06-18 ENCOUNTER — Encounter: Payer: Self-pay | Admitting: Pulmonary Disease

## 2012-06-18 ENCOUNTER — Ambulatory Visit (INDEPENDENT_AMBULATORY_CARE_PROVIDER_SITE_OTHER): Payer: BC Managed Care – PPO | Admitting: Pulmonary Disease

## 2012-06-18 VITALS — BP 100/60 | HR 66 | Temp 98.3°F | Ht 62.0 in | Wt 234.8 lb

## 2012-06-18 DIAGNOSIS — G4733 Obstructive sleep apnea (adult) (pediatric): Secondary | ICD-10-CM

## 2012-06-18 NOTE — Assessment & Plan Note (Addendum)
Chk download on cpap Pointers to decrease dryness - increase humidity setting  - Humidifier in room -change to nasal mask with chin strap -lower pressure if possible  Weight loss encouraged, compliance with goal of at least 4-6 hrs every night is the expectation. Advised against medications with sedative side effects Cautioned against driving when sleepy - understanding that sleepiness will vary on a day to day basis

## 2012-06-18 NOTE — Patient Instructions (Addendum)
Chk download on cpap Pointers to decrease dryness - increase humidity setting  - Humidifier in room -change to nasal mask with chin strap -lower pressure if possible

## 2012-06-18 NOTE — Progress Notes (Signed)
  Subjective:    Patient ID: Sydney Thompson, female    DOB: [birth year], 51 y.o.   MRN: [id number]  HPI 51/F for FU of severe OSA on CPAP 10 cm PSG showed obstructive sleep apnea where she stopped breathing 35 times per hour & her O2 level dropped. - corrected by CPAP 10 cm, small full face mask Last seen 7/12  06/18/2012 wears cpap every other night. denies any problems w/ mask/machine. she c/o dry mouth once takes cpap mask off. Pressure setting at 10 is much better-12 was too high Mask ok, pr ok, no dryness  Past Medical History  Diagnosis Date  . Depression   . GERD (gastroesophageal reflux disease)   . IBS (irritable bowel syndrome)      Review of Systems neg for any significant sore throat, dysphagia, itching, sneezing, nasal congestion or excess/ purulent secretions, fever, chills, sweats, unintended wt loss, pleuritic or exertional cp, hempoptysis, orthopnea pnd or change in chronic leg swelling. Also denies presyncope, palpitations, heartburn, abdominal pain, nausea, vomiting, diarrhea or change in bowel or urinary habits, dysuria,hematuria, rash, arthralgias, visual complaints, headache, numbness weakness or ataxia.     Objective:   Physical Exam Gen. Pleasant, well-nourished, in no distress ENT - no lesions, no post nasal drip Neck: No JVD, no thyromegaly, no carotid bruits Lungs: no use of accessory muscles, no dullness to percussion, clear without rales or rhonchi  Cardiovascular: Rhythm regular, heart sounds  normal, no murmurs or gallops, no peripheral edema Musculoskeletal: No deformities, no cyanosis or clubbing          Assessment & Plan:

## 2012-07-07 ENCOUNTER — Telehealth: Payer: Self-pay | Admitting: Pulmonary Disease

## 2012-07-07 NOTE — Telephone Encounter (Signed)
Download 8/13 reviewed on 10 cm >> cpap very effective, cuts out events compliance with goal of at least 6 hrs every night is the expectation. Hope her dryness is better

## 2012-07-08 NOTE — Telephone Encounter (Signed)
lmomtcb x1 

## 2012-07-09 NOTE — Telephone Encounter (Signed)
I spoke with patient about results and she verbalized understanding and had no questions 

## 2012-08-12 ENCOUNTER — Other Ambulatory Visit: Payer: Self-pay

## 2012-08-12 MED ORDER — SERTRALINE HCL 100 MG PO TABS: 150.0000 mg | ORAL_TABLET | Freq: Every day | ORAL | Status: DC

## 2012-08-12 NOTE — Telephone Encounter (Signed)
OV 11/18/11. Looks like an historical med that [employer] hasn't filled. Plz advise     MW

## 2012-08-12 NOTE — Telephone Encounter (Signed)
Discussed with patient--- Rx sent     KP 

## 2012-08-18 ENCOUNTER — Ambulatory Visit (INDEPENDENT_AMBULATORY_CARE_PROVIDER_SITE_OTHER): Payer: BC Managed Care – PPO

## 2012-08-18 DIAGNOSIS — Z23 Encounter for immunization: Secondary | ICD-10-CM

## 2012-08-22 ENCOUNTER — Ambulatory Visit (INDEPENDENT_AMBULATORY_CARE_PROVIDER_SITE_OTHER): Payer: BC Managed Care – PPO | Admitting: Family Medicine

## 2012-08-22 ENCOUNTER — Ambulatory Visit (HOSPITAL_COMMUNITY)
Admission: RE | Admit: 2012-08-22 | Discharge: 2012-08-22 | Disposition: A | Discharge status: Home / Self Care | Payer: BC Managed Care – PPO | Source: Ambulatory Visit | Attending: Family Medicine | Admitting: Family Medicine

## 2012-08-22 ENCOUNTER — Other Ambulatory Visit: Payer: Self-pay | Admitting: Internal Medicine

## 2012-08-22 VITALS — BP 132/80 | HR 66 | Temp 98.1°F | Resp 18 | Ht 63.0 in | Wt 236.0 lb

## 2012-08-22 DIAGNOSIS — R1031 Right lower quadrant pain: Secondary | ICD-10-CM | POA: Insufficient documentation

## 2012-08-22 DIAGNOSIS — R109 Unspecified abdominal pain: Secondary | ICD-10-CM

## 2012-08-22 DIAGNOSIS — K7689 Other specified diseases of liver: Secondary | ICD-10-CM | POA: Insufficient documentation

## 2012-08-22 DIAGNOSIS — R350 Frequency of micturition: Secondary | ICD-10-CM | POA: Insufficient documentation

## 2012-08-22 DIAGNOSIS — R112 Nausea with vomiting, unspecified: Secondary | ICD-10-CM | POA: Insufficient documentation

## 2012-08-22 DIAGNOSIS — N200 Calculus of kidney: Secondary | ICD-10-CM

## 2012-08-22 DIAGNOSIS — N289 Disorder of kidney and ureter, unspecified: Secondary | ICD-10-CM | POA: Insufficient documentation

## 2012-08-22 DIAGNOSIS — N201 Calculus of ureter: Secondary | ICD-10-CM | POA: Insufficient documentation

## 2012-08-22 LAB — BUN: BUN: 15 mg/dL (ref 6–23)

## 2012-08-22 LAB — POCT URINALYSIS DIPSTICK
Bilirubin, UA: NEGATIVE
Glucose, UA: NEGATIVE
Ketones, UA: NEGATIVE
Leukocytes, UA: NEGATIVE
Nitrite, UA: NEGATIVE
Spec Grav, UA: 1.025
Urobilinogen, UA: 0.2
pH, UA: 6.5

## 2012-08-22 LAB — POCT CBC
Granulocyte percent: 86.3 %G — AB (ref 37–80)
MID (cbc): 0.4 (ref 0–0.9)
POC Granulocyte: 11 — AB (ref 2–6.9)
POC LYMPH PERCENT: 10.5 %L (ref 10–50)
Platelet Count, POC: 259 10*3/uL (ref 142–424)
RBC: 4.36 M/uL (ref 4.04–5.48)
RDW, POC: 14 %

## 2012-08-22 LAB — POCT UA - MICROSCOPIC ONLY
Casts, Ur, LPF, POC: NEGATIVE
Crystals, Ur, HPF, POC: NEGATIVE
Mucus, UA: NEGATIVE
Yeast, UA: NEGATIVE

## 2012-08-22 LAB — BASIC METABOLIC PANEL
Calcium: 9.4 mg/dL (ref 8.4–10.5)
Potassium: 4.2 mEq/L (ref 3.5–5.3)
Sodium: 137 mEq/L (ref 135–145)

## 2012-08-22 LAB — POCT URINE PREGNANCY: Preg Test, Ur: NEGATIVE

## 2012-08-22 MED ORDER — IOHEXOL 240 MG/ML SOLN
40.0000 mL | Freq: Once | INTRAMUSCULAR | Status: AC | PRN
  Administered 2012-08-22: 40 mL via ORAL

## 2012-08-22 MED ORDER — IOHEXOL 300 MG/ML  SOLN
100.0000 mL | Freq: Once | INTRAMUSCULAR | Status: AC | PRN
  Administered 2012-08-22: 100 mL via INTRAVENOUS

## 2012-08-22 MED ORDER — HYDROCODONE-ACETAMINOPHEN 5-500 MG PO TABS: 1.0000 | ORAL_TABLET | Freq: Three times a day (TID) | ORAL | Status: DC | PRN

## 2012-08-22 MED ORDER — ONDANSETRON HCL 8 MG PO TABS: 8.0000 mg | ORAL_TABLET | Freq: Three times a day (TID) | ORAL | Status: DC | PRN

## 2012-08-22 MED ORDER — TAMSULOSIN HCL 0.4 MG PO CAPS: 0.4000 mg | ORAL_CAPSULE | Freq: Every day | ORAL | Status: DC

## 2012-08-22 NOTE — Progress Notes (Signed)
[facility] [address], [place] [address] [phone]  Date:  08/22/2012   Name:  Sydney Thompson   DOB:  08-Jul-1961   MRN:  [id number]  PCP:  Loreen Freud, DO    Chief Complaint: nausea and vomiting, Abdominal Pain and Urinary Frequency   History of Present Illness:  Sydney Thompson is a 51 y.o. very pleasant female patient who presents with the following:  Last night she noted LLQ abdominal pain.  She felt as through she needed to urinate, but could not. She vomited once- has not vomited again.  Continues to feel nauseated.  She has less pain now but just feels ill.  She has not noted any fever.  She has not had anything to eat or drink today- she still feels nauseated.    She continues to have urinary frequency, but no dysuria.  No hematuria.  She has had kidney stones in the past- this does remind her of those episodes She has a urologist at [facility].  She had had lithotripsy twice- last done in 2012.    She has had a cholecystectomy - no other operations.  No history of diverticulitis.    Patient Active Problem List  Diagnosis  . LIPOMA OF OTHER SKIN AND SUBCUTANEOUS TISSUE  . DEPRESSION  . GERD  . IRRITABLE BOWEL SYNDROME  . ABDOMINAL BLOATING  . POSTMENOPAUSAL STATUS  . HYPERLIPIDEMIA  . CERVICAL LYMPHADENOPATHY  . CARPAL TUNNEL SYNDROME, BILATERAL  . OSA (obstructive sleep apnea)    Past Medical History  Diagnosis Date  . Depression   . GERD (gastroesophageal reflux disease)   . IBS (irritable bowel syndrome)     Past Surgical History  Procedure Date  . Cholecystectomy   . Lithotripsy     Dr. Vonita Moss  . Gallbladder surgery     History  Substance Use Topics  . Smoking status: Never Smoker   . Smokeless tobacco: Not on file  . Alcohol Use: No    Family History  Problem Relation Age of Onset  . Cancer Mother     breast  . Alzheimer's disease Mother   . Stroke Father   . Heart disease Father     MI  .  Hyperlipidemia Father   . Diabetes Father   . Diabetes Paternal Uncle   . Diabetes Paternal Uncle   . Diabetes Paternal Uncle     No Known Allergies  Medication list has been reviewed and updated.  Current Outpatient Prescriptions on File Prior to Visit  Medication Sig Dispense Refill  . sertraline (ZOLOFT) 100 MG tablet Take 1.5 tablets (150 mg total) by mouth daily.  45 tablet  2    Review of Systems:  As per HPI- otherwise negative.   Physical Examination: Filed Vitals:   08/22/12 1036  BP: 132/80  Pulse: 66  Temp: 98.1 F (36.7 C)  Resp: 18   Filed Vitals:   08/22/12 1036  Height: 5\' 3"  (1.6 m)  Weight: 236 lb (107.049 kg)   Body mass index is 41.81 kg/(m^2). Ideal Body Weight: Weight in (lb) to have BMI = 25: 140.8   GEN: WDWN, NAD, Non-toxic, A & O x 3, obese HEENT: Atraumatic, Normocephalic. Neck supple. No masses, No LAD. Ears and Nose: No external deformity. CV: RRR, No M/G/R. No JVD. No thrill. No extra heart sounds. PULM: CTA B, no wheezes, crackles, rhonchi. No retractions. No resp. distress. No accessory muscle use. ABD: S, ND, +BS. No rebound. No  HSM.  She has lower quadrant tenderness slightly on the left but more so on the right.   EXTR: No c/c/e NEURO Normal gait.  PSYCH: Normally interactive. Conversant. Not depressed or anxious appearing.  Calm demeanor.  GU: no CMT.  No adnexal masses.  She does have mild RLQ tenderness on BME.  Normal internal and external genitals Results for orders placed in visit on 08/22/12  POCT UA - MICROSCOPIC ONLY      Component Value Range   WBC, Ur, HPF, POC 0-1     RBC, urine, microscopic 15-25     Bacteria, U Microscopic trace     Mucus, UA neg     Epithelial cells, urine per micros 2-6     Crystals, Ur, HPF, POC neg     Casts, Ur, LPF, POC neg     Yeast, UA neg    POCT URINALYSIS DIPSTICK      Component Value Range   Color, UA yellow     Clarity, UA clear     Glucose, UA neg     Bilirubin, UA neg      Ketones, UA neg     Spec Grav, UA 1.025     Blood, UA large     pH, UA 6.5     Protein, UA 30mg /dL     Urobilinogen, UA 0.2     Nitrite, UA neg     Leukocytes, UA Negative    POCT CBC      Component Value Range   WBC 12.7 (*) 4.6 - 10.2 K/uL   Lymph, poc 1.3  0.6 - 3.4   POC LYMPH PERCENT 10.5  10 - 50 %L   MID (cbc) 0.4  0 - 0.9   POC MID % 3.2  0 - 12 %M   POC Granulocyte 11.0 (*) 2 - 6.9   Granulocyte percent 86.3 (*) 37 - 80 %G   RBC 4.36  4.04 - 5.48 M/uL   Hemoglobin 13.0  12.2 - 16.2 g/dL   HCT, POC 86.5  78.4 - 47.9 %   MCV 96.9  80 - 97 fL   MCH, POC 29.8  27 - 31.2 pg   MCHC 30.8 (*) 31.8 - 35.4 g/dL   RDW, POC 69.6     Platelet Count, POC 259  142 - 424 K/uL   MPV 9.8  0 - 99.8 fL  POCT URINE PREGNANCY      Component Value Range   Preg Test, Ur Negative      Assessment and Plan: 1. Abdominal  pain, other specified site  POCT CBC, POCT urine pregnancy, Basic metabolic panel, CT Abdomen Pelvis W Contrast  2. Urinary frequency  POCT UA - Microscopic Only, POCT urinalysis dipstick   Paticia is having abdominal pain today- I am concerned that she may have appendicitis as she has an elevated wbc count and RLQ tenderness on exam.  Referral for a CT today- she came to clinic with a friend who drove her to have her CT  COPLAND,JESSICA, MD  CT ABDOMEN AND PELVIS WITH CONTRAST  Technique: Multidetector CT imaging of the abdomen and pelvis was performed following the standard protocol during bolus administration of intravenous contrast.  Contrast: OMNIPAQUE IOHEXOL 300 MG/ML SOLN, 40mL OMNIPAQUE IOHEXOL 240 MG/ML SOLN  Comparison: CT urogram 11/26/2009.  Findings: There is mild dependent atelectasis at both lung bases. There is no significant pleural effusion. The liver demonstrates diffuse steatosis status post cholecystectomy. There is no biliary dilatation. The  pancreas, spleen and adrenal glands appear normal.  There is moderate left-sided  hydronephrosis and hydroureter associated with asymmetric perinephric soft tissue stranding. There is delayed contrast excretion from the left kidney. At the left ureteral vesicle junction, there is a 6 mm calculus on image 84. Tiny renal calculi are present bilaterally. There is no evidence of right-sided collecting system obstruction. There are several small low-density renal lesions bilaterally which are likely cysts. In addition, there is an indeterminate 1.9-cm lesion inferiorly in the right kidney on image 48. This measures 67 HU on the delayed images. This is difficult to see on the prior noncontrast study.  The terminal ileum and cecum are well opacified with contrast. The cecum is directed medially. The ileocecal valve is visualized. The appendix is not well seen, although is likely visualized on the sagittal images and appears normal in caliber. There is no pericecal inflammatory change. There is no evidence of bowel obstruction.  The uterus and ovaries appear normal. The bladder appears normal.  There are degenerative changes in the lower lumbar spine.  IMPRESSION:  1. Obstructing 6 mm calculus at the left ureteral vesicle junction. 2. No evidence of appendicitis. 3. Hepatic steatosis. 4. Indeterminate right renal lesion, possibly a complex cyst. There are additional simple appearing renal cysts bilaterally. Further imaging following relief of the ureteral obstruction is recommended.   Received CT results and discussed with MD on call for [facility].  Then called and talked with Elease Hashimoto. She may go home and try to pass stone.  If not able to tolerate po, fever, etc she will go to ED.  Otherwise push fluids, use medications as below and call urology in the morning.  She understood plan and will go pick up medications  Meds ordered this encounter  Medications  . ondansetron (ZOFRAN) 8 MG tablet    Sig: Take 1 tablet (8 mg total) by mouth every 8 (eight) hours  as needed for nausea.    Dispense:  15 tablet    Refill:  0  . HYDROcodone-acetaminophen (VICODIN) 5-500 MG per tablet    Sig: Take 1 tablet by mouth every 8 (eight) hours as needed for pain.    Dispense:  20 tablet    Refill:  0  . Tamsulosin HCl (FLOMAX) 0.4 MG CAPS    Sig: Take 1 capsule (0.4 mg total) by mouth daily.    Dispense:  30 capsule    Refill:  0

## 2012-08-22 NOTE — Patient Instructions (Signed)
Please go to [facility] to the Radiology department.  They will get you started drinking contrast for your CT scan.  We are doing a CT to look for kidney stones and to make sure there is no other cause of your abdominal pain such as appendicitis.  After your scan is done it will be read right away and then they will call us with your results.

## 2012-08-23 ENCOUNTER — Telehealth: Payer: Self-pay | Admitting: Radiology

## 2012-08-23 ENCOUNTER — Telehealth: Payer: Self-pay | Admitting: Family Medicine

## 2012-08-23 ENCOUNTER — Encounter: Payer: Self-pay | Admitting: Family Medicine

## 2012-08-23 ENCOUNTER — Encounter: Payer: Self-pay | Admitting: Radiology

## 2012-08-23 NOTE — Telephone Encounter (Signed)
Called and LMOM- called to check on how she was doing.  If she needs anything please let us know.  Also, she needs to have her renal cysts checked once she is better.  This is something her urologist can likely arrange- ?do an ultrasound- but if she needs any help with this let us know.

## 2012-08-23 NOTE — Telephone Encounter (Signed)
Patient called back, she is feeling better. She advised her urologist is Dr Margarita Grizzle. I have faxed the scan to him. Tamelia Michalowski

## 2012-08-27 ENCOUNTER — Telehealth: Payer: Self-pay

## 2012-08-27 NOTE — Telephone Encounter (Signed)
Pt would like for the results of her CT scan to be explained to her. Best# [phone]

## 2012-08-27 NOTE — Telephone Encounter (Signed)
Called and went over her CT results with her- answered all questions and reinforced that she is to discuss the renal cyst seen on her Ct with her urologist- she will see them on Tuesday

## 2012-09-17 ENCOUNTER — Other Ambulatory Visit: Payer: Self-pay | Admitting: *Deleted

## 2012-09-17 MED ORDER — SERTRALINE HCL 100 MG PO TABS: 150.0000 mg | ORAL_TABLET | Freq: Every day | ORAL | Status: DC

## 2012-09-17 NOTE — Telephone Encounter (Signed)
Pt left msg on triage requesting a refill for sertraline 100mg  [facility]

## 2013-02-01 ENCOUNTER — Encounter: Payer: Self-pay | Admitting: Family Medicine

## 2013-02-01 ENCOUNTER — Other Ambulatory Visit (HOSPITAL_COMMUNITY): Payer: Self-pay | Admitting: Urology

## 2013-02-01 DIAGNOSIS — N281 Cyst of kidney, acquired: Secondary | ICD-10-CM

## 2013-02-24 ENCOUNTER — Ambulatory Visit (HOSPITAL_COMMUNITY)
Admission: RE | Admit: 2013-02-24 | Discharge: 2013-02-24 | Disposition: A | Discharge status: Home / Self Care | Payer: BC Managed Care – PPO | Source: Ambulatory Visit | Attending: Urology | Admitting: Urology

## 2013-02-24 DIAGNOSIS — K7689 Other specified diseases of liver: Secondary | ICD-10-CM | POA: Insufficient documentation

## 2013-02-24 DIAGNOSIS — N289 Disorder of kidney and ureter, unspecified: Secondary | ICD-10-CM | POA: Insufficient documentation

## 2013-02-24 DIAGNOSIS — N281 Cyst of kidney, acquired: Secondary | ICD-10-CM | POA: Insufficient documentation

## 2013-02-24 MED ORDER — GADOBENATE DIMEGLUMINE 529 MG/ML IV SOLN
20.0000 mL | Freq: Once | INTRAVENOUS | Status: AC | PRN
  Administered 2013-02-24: 20 mL via INTRAVENOUS

## 2013-05-03 ENCOUNTER — Other Ambulatory Visit: Payer: Self-pay | Admitting: Family Medicine

## 2013-05-25 ENCOUNTER — Telehealth: Payer: Self-pay | Admitting: Pulmonary Disease

## 2013-05-25 NOTE — Telephone Encounter (Signed)
Called patient x3 and lm for return appointment. No return call back. Sent letter 05/25/13. °

## 2013-09-30 ENCOUNTER — Other Ambulatory Visit (HOSPITAL_COMMUNITY): Payer: Self-pay | Admitting: Urology

## 2013-09-30 DIAGNOSIS — N281 Cyst of kidney, acquired: Secondary | ICD-10-CM

## 2013-10-29 ENCOUNTER — Ambulatory Visit (INDEPENDENT_AMBULATORY_CARE_PROVIDER_SITE_OTHER): Payer: BC Managed Care – PPO | Admitting: Physician Assistant

## 2013-10-29 DIAGNOSIS — Z23 Encounter for immunization: Secondary | ICD-10-CM

## 2013-10-29 DIAGNOSIS — Z111 Encounter for screening for respiratory tuberculosis: Secondary | ICD-10-CM

## 2013-10-29 NOTE — Progress Notes (Signed)
  Tuberculosis Risk Questionnaire  1. No Were you born outside the [place] in one of the following parts of the world: [place], [place], [place], [place] or [place]?    2. No Have you traveled outside the [place] and lived for more than one month in one of the following parts of the world: [place], [place], [place], [place] or [place]?    3. No Do you have a compromised immune system such as from any of the following conditions:HIV/AIDS, organ or bone marrow transplantation, diabetes, immunosuppressive medicines (e.g. Prednisone, Remicaide), leukemia, lymphoma, cancer of the head or neck, gastrectomy or jejunal bypass, end-stage renal disease (on dialysis), or silicosis?     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    5. No Have you ever: injected illegal drugs, used crack cocaine, lived in a homeless shelter  or been in jail or prison?     6. No Have you ever been exposed to anyone with infectious tuberculosis?    Tuberculosis Symptom Questionnaire  Do you currently have any of the following symptoms?  1. Yes 10 months Unexplained cough lasting more than 3 weeks?   2. No Unexplained fever lasting more than 3 weeks.   3. No Night Sweats (sweating that leaves the bedclothes and sheets wet)     4. No Shortness of Breath   5. No Chest Pain   6. No Unintentional weight loss    7. No Unexplained fatigue (very tired for no reason)

## 2013-10-29 NOTE — Progress Notes (Signed)
Patient here for screening TB test and flu shot. Does admit to cough x 10 months. Denies recent travel. No fevers, chills, night sweats, or weight loss. Ok to place TB test. Recommend evaluation of cough at future visits. Patient not seen by a provider today.

## 2013-10-31 ENCOUNTER — Ambulatory Visit (HOSPITAL_COMMUNITY)
Admission: RE | Admit: 2013-10-31 | Discharge: 2013-10-31 | Disposition: A | Discharge status: Home / Self Care | Payer: BC Managed Care – PPO | Source: Ambulatory Visit | Attending: Urology | Admitting: Urology

## 2013-10-31 ENCOUNTER — Ambulatory Visit (INDEPENDENT_AMBULATORY_CARE_PROVIDER_SITE_OTHER): Payer: BC Managed Care – PPO | Admitting: Physician Assistant

## 2013-10-31 DIAGNOSIS — Z111 Encounter for screening for respiratory tuberculosis: Secondary | ICD-10-CM

## 2013-10-31 DIAGNOSIS — N281 Cyst of kidney, acquired: Secondary | ICD-10-CM | POA: Insufficient documentation

## 2013-10-31 DIAGNOSIS — N289 Disorder of kidney and ureter, unspecified: Secondary | ICD-10-CM | POA: Insufficient documentation

## 2013-10-31 LAB — TB SKIN TEST
INDURATION: 0 mm
TB Skin Test: NEGATIVE

## 2013-10-31 NOTE — Progress Notes (Signed)
   Subjective:    Patient ID: Sydney Thompson, female    DOB: 01-Nov-1960, 53 y.o.   MRN: [id number]  HPI tb reading. Tb skin test placed 10/29/13    Review of Systems     Objective:   Physical Exam        Assessment & Plan:  Tb skin test negative 0 mm induration

## 2014-05-22 ENCOUNTER — Telehealth: Payer: Self-pay | Admitting: Family Medicine

## 2014-05-22 NOTE — Telephone Encounter (Addendum)
This patient has not been seen since 2013. Apt scheduled for 07/27/14.  Please advise          KP

## 2014-05-22 NOTE — Telephone Encounter (Signed)
Pt needs re-fill on sertraline (ZOLOFT) 100 MG tablet [facility] on [address]

## 2014-05-22 NOTE — Telephone Encounter (Signed)
She would need appointment to discuss.  °

## 2014-05-23 NOTE — Telephone Encounter (Signed)
LMOM for pt to call and schedule appt.

## 2014-05-29 ENCOUNTER — Encounter: Payer: Self-pay | Admitting: Family Medicine

## 2014-05-29 ENCOUNTER — Ambulatory Visit (INDEPENDENT_AMBULATORY_CARE_PROVIDER_SITE_OTHER): Payer: BC Managed Care – PPO | Admitting: Family Medicine

## 2014-05-29 VITALS — BP 100/70 | HR 82 | Temp 97.8°F | Ht 63.0 in | Wt 235.8 lb

## 2014-05-29 DIAGNOSIS — F329 Major depressive disorder, single episode, unspecified: Secondary | ICD-10-CM

## 2014-05-29 DIAGNOSIS — F3289 Other specified depressive episodes: Secondary | ICD-10-CM

## 2014-05-29 DIAGNOSIS — F32A Depression, unspecified: Secondary | ICD-10-CM

## 2014-05-29 MED ORDER — SERTRALINE HCL 100 MG PO TABS: 150.0000 mg | ORAL_TABLET | Freq: Every day | ORAL | Status: DC

## 2014-05-29 NOTE — Patient Instructions (Signed)

## 2014-05-29 NOTE — Progress Notes (Signed)
   Subjective:    Patient ID: Sydney Thompson, female    DOB: Jan 29, 1961, 53 y.o.   MRN: [id number]  HPI Pt here to f/u depression. Pt ran out of meds 1 week ago so states she is not feeling so great right now and is anxious to get on meds again.  She has a cpe scheduled for Oct.     Review of Systems As above    Objective:   Physical Exam  BP 100/70  Pulse 82  Temp(Src) 97.8 F (36.6 C) (Oral)  Ht 5\' 3"  (1.6 m)  Wt 235 lb 12.8 oz (106.958 kg)  BMI 41.78 kg/m2  SpO2 96% General appearance: alert, cooperative, appears stated age and no distress Neurologic: Alert and oriented X 3, normal strength and tone. Normal symmetric reflexes. Normal coordination and gait      Assessment & Plan:  1. Depression Con' tmeds-- pt ran out 1 week ago - sertraline (ZOLOFT) 100 MG tablet; Take 1.5 tablets (150 mg total) by mouth daily.  Dispense: 135 tablet; Refill: 3

## 2014-05-29 NOTE — Progress Notes (Signed)
Pre visit review using our clinic review tool, if applicable. No additional management support is needed unless otherwise documented below in the visit note. 

## 2014-07-19 ENCOUNTER — Telehealth: Payer: Self-pay | Admitting: Lab

## 2014-07-19 NOTE — Telephone Encounter (Signed)
Dr. Etter Sjogren   This patient is coming in for lab with no orders please advice.

## 2014-07-20 ENCOUNTER — Other Ambulatory Visit: Payer: Self-pay | Admitting: Family Medicine

## 2014-07-20 ENCOUNTER — Other Ambulatory Visit: Payer: BC Managed Care – PPO

## 2014-07-20 ENCOUNTER — Other Ambulatory Visit (INDEPENDENT_AMBULATORY_CARE_PROVIDER_SITE_OTHER): Payer: BC Managed Care – PPO

## 2014-07-20 DIAGNOSIS — R748 Abnormal levels of other serum enzymes: Secondary | ICD-10-CM

## 2014-07-20 DIAGNOSIS — E785 Hyperlipidemia, unspecified: Secondary | ICD-10-CM

## 2014-07-20 DIAGNOSIS — Z0000 Encounter for general adult medical examination without abnormal findings: Secondary | ICD-10-CM

## 2014-07-20 DIAGNOSIS — R8290 Unspecified abnormal findings in urine: Secondary | ICD-10-CM

## 2014-07-20 LAB — CBC WITH DIFFERENTIAL/PLATELET
BASOS PCT: 0.9 % (ref 0.0–3.0)
Basophils Absolute: 0.1 10*3/uL (ref 0.0–0.1)
EOS PCT: 1.6 % (ref 0.0–5.0)
Eosinophils Absolute: 0.1 10*3/uL (ref 0.0–0.7)
HEMATOCRIT: 38.6 % (ref 36.0–46.0)
HEMOGLOBIN: 12.9 g/dL (ref 12.0–15.0)
LYMPHS ABS: 1.5 10*3/uL (ref 0.7–4.0)
Lymphocytes Relative: 19.2 % (ref 12.0–46.0)
MCHC: 33.3 g/dL (ref 30.0–36.0)
MCV: 93.5 fl (ref 78.0–100.0)
MONO ABS: 0.5 10*3/uL (ref 0.1–1.0)
MONOS PCT: 6.9 % (ref 3.0–12.0)
NEUTROS ABS: 5.5 10*3/uL (ref 1.4–7.7)
Neutrophils Relative %: 71.4 % (ref 43.0–77.0)
Platelets: 202 10*3/uL (ref 150.0–400.0)
RBC: 4.13 Mil/uL (ref 3.87–5.11)
RDW: 13.5 % (ref 11.5–15.5)
WBC: 7.7 10*3/uL (ref 4.0–10.5)

## 2014-07-20 LAB — POCT URINALYSIS DIPSTICK
BILIRUBIN UA: NEGATIVE
GLUCOSE UA: NEGATIVE
Ketones, UA: NEGATIVE
NITRITE UA: NEGATIVE
PH UA: 5.5
RBC UA: NEGATIVE
Urobilinogen, UA: 2

## 2014-07-20 LAB — LIPID PANEL
CHOL/HDL RATIO: 7
Cholesterol: 255 mg/dL — ABNORMAL HIGH (ref 0–200)
HDL: 35.6 mg/dL — AB (ref >39.00)
NONHDL: 219.4
TRIGLYCERIDES: 368 mg/dL — AB (ref 0.0–149.0)
VLDL: 73.6 mg/dL — ABNORMAL HIGH (ref 0.0–40.0)

## 2014-07-20 LAB — HEPATIC FUNCTION PANEL
ALBUMIN: 3.9 g/dL (ref 3.5–5.2)
ALT: 43 U/L — ABNORMAL HIGH (ref 0–35)
AST: 35 U/L (ref 0–37)
Alkaline Phosphatase: 79 U/L (ref 39–117)
BILIRUBIN TOTAL: 0 mg/dL — AB (ref 0.2–1.2)
Bilirubin, Direct: 0 mg/dL (ref 0.0–0.3)
Total Protein: 7.5 g/dL (ref 6.0–8.3)

## 2014-07-20 LAB — BASIC METABOLIC PANEL
BUN: 10 mg/dL (ref 6–23)
CALCIUM: 9.4 mg/dL (ref 8.4–10.5)
CO2: 26 meq/L (ref 19–32)
CREATININE: 0.7 mg/dL (ref 0.4–1.2)
Chloride: 104 mEq/L (ref 96–112)
GFR: 88.07 mL/min (ref >60.00)
GLUCOSE: 99 mg/dL (ref 70–99)
Potassium: 4.1 mEq/L (ref 3.5–5.1)
SODIUM: 137 meq/L (ref 135–145)

## 2014-07-20 LAB — LDL CHOLESTEROL, DIRECT: Direct LDL: 161.1 mg/dL

## 2014-07-20 LAB — TSH: TSH: 1.54 u[IU]/mL (ref 0.35–4.50)

## 2014-07-20 NOTE — Telephone Encounter (Signed)
No documentation in chart indicating that labs were to be ordered, please advise      KP

## 2014-07-20 NOTE — Telephone Encounter (Signed)
done

## 2014-07-21 LAB — URINE CULTURE: Colony Count: >=100000

## 2014-07-27 ENCOUNTER — Encounter: Payer: Self-pay | Admitting: Family Medicine

## 2014-07-27 ENCOUNTER — Ambulatory Visit (INDEPENDENT_AMBULATORY_CARE_PROVIDER_SITE_OTHER): Payer: BC Managed Care – PPO | Admitting: Family Medicine

## 2014-07-27 VITALS — BP 112/74 | HR 80 | Temp 98.1°F | Ht 62.0 in | Wt 235.6 lb

## 2014-07-27 DIAGNOSIS — E2839 Other primary ovarian failure: Secondary | ICD-10-CM

## 2014-07-27 DIAGNOSIS — M609 Myositis, unspecified: Secondary | ICD-10-CM

## 2014-07-27 DIAGNOSIS — Z0000 Encounter for general adult medical examination without abnormal findings: Secondary | ICD-10-CM

## 2014-07-27 DIAGNOSIS — Z23 Encounter for immunization: Secondary | ICD-10-CM

## 2014-07-27 DIAGNOSIS — M79643 Pain in unspecified hand: Secondary | ICD-10-CM

## 2014-07-27 DIAGNOSIS — IMO0001 Reserved for inherently not codable concepts without codable children: Secondary | ICD-10-CM

## 2014-07-27 DIAGNOSIS — L309 Dermatitis, unspecified: Secondary | ICD-10-CM

## 2014-07-27 DIAGNOSIS — G5601 Carpal tunnel syndrome, right upper limb: Secondary | ICD-10-CM

## 2014-07-27 DIAGNOSIS — G5603 Carpal tunnel syndrome, bilateral upper limbs: Secondary | ICD-10-CM

## 2014-07-27 DIAGNOSIS — G5602 Carpal tunnel syndrome, left upper limb: Secondary | ICD-10-CM

## 2014-07-27 DIAGNOSIS — Z1239 Encounter for other screening for malignant neoplasm of breast: Secondary | ICD-10-CM

## 2014-07-27 DIAGNOSIS — M791 Myalgia: Secondary | ICD-10-CM

## 2014-07-27 MED ORDER — DESOXIMETASONE 0.25 % EX OINT
1.0000 | TOPICAL_OINTMENT | Freq: Two times a day (BID) | CUTANEOUS | Status: DC
Start: 2014-07-27 — End: 2017-01-19

## 2014-07-27 NOTE — Patient Instructions (Signed)
Preventive Care for Adults A healthy lifestyle and preventive care can promote health and wellness. Preventive health guidelines for women include the following key practices.  A routine yearly physical is a good way to check with your health care provider about your health and preventive screening. It is a chance to share any concerns and updates on your health and to receive a thorough exam.  Visit your dentist for a routine exam and preventive care every 6 months. Brush your teeth twice a day and floss once a day. Good oral hygiene prevents tooth decay and gum disease.  The frequency of eye exams is based on your age, health, family medical history, use of contact lenses, and other factors. Follow your health care provider's recommendations for frequency of eye exams.  Eat a healthy diet. Foods like vegetables, fruits, whole grains, low-fat dairy products, and lean protein foods contain the nutrients you need without too many calories. Decrease your intake of foods high in solid fats, added sugars, and salt. Eat the right amount of calories for you.Get information about a proper diet from your health care provider, if necessary.  Regular physical exercise is one of the most important things you can do for your health. Most adults should get at least 150 minutes of moderate-intensity exercise (any activity that increases your heart rate and causes you to sweat) each week. In addition, most adults need muscle-strengthening exercises on 2 or more days a week.  Maintain a healthy weight. The body mass index (BMI) is a screening tool to identify possible weight problems. It provides an estimate of body fat based on height and weight. Your health care provider can find your BMI and can help you achieve or maintain a healthy weight.For adults 20 years and older:  A BMI below 18.5 is considered underweight.  A BMI of 18.5 to 24.9 is normal.  A BMI of 25 to 29.9 is considered overweight.  A BMI of  30 and above is considered obese.  Maintain normal blood lipids and cholesterol levels by exercising and minimizing your intake of saturated fat. Eat a balanced diet with plenty of fruit and vegetables. Blood tests for lipids and cholesterol should begin at age 76 and be repeated every 5 years. If your lipid or cholesterol levels are high, you are over 50, or you are at high risk for heart disease, you may need your cholesterol levels checked more frequently.Ongoing high lipid and cholesterol levels should be treated with medicines if diet and exercise are not working.  If you smoke, find out from your health care provider how to quit. If you do not use tobacco, do not start.  Lung cancer screening is recommended for adults aged 22-80 years who are at high risk for developing lung cancer because of a history of smoking. A yearly low-dose CT scan of the lungs is recommended for people who have at least a 30-pack-year history of smoking and are a current smoker or have quit within the past 15 years. A pack year of smoking is smoking an average of 1 pack of cigarettes a day for 1 year (for example: 1 pack a day for 30 years or 2 packs a day for 15 years). Yearly screening should continue until the smoker has stopped smoking for at least 15 years. Yearly screening should be stopped for people who develop a health problem that would prevent them from having lung cancer treatment.  If you are pregnant, do not drink alcohol. If you are breastfeeding,  be very cautious about drinking alcohol. If you are not pregnant and choose to drink alcohol, do not have more than 1 drink per day. One drink is considered to be 12 ounces (355 mL) of beer, 5 ounces (148 mL) of wine, or 1.5 ounces (44 mL) of liquor.  Avoid use of street drugs. Do not share needles with anyone. Ask for help if you need support or instructions about stopping the use of drugs.  High blood pressure causes heart disease and increases the risk of  stroke. Your blood pressure should be checked at least every 1 to 2 years. Ongoing high blood pressure should be treated with medicines if weight loss and exercise do not work.  If you are 75-52 years old, ask your health care provider if you should take aspirin to prevent strokes.  Diabetes screening involves taking a blood sample to check your fasting blood sugar level. This should be done once every 3 years, after age 15, if you are within normal weight and without risk factors for diabetes. Testing should be considered at a younger age or be carried out more frequently if you are overweight and have at least 1 risk factor for diabetes.  Breast cancer screening is essential preventive care for women. You should practice "breast self-awareness." This means understanding the normal appearance and feel of your breasts and may include breast self-examination. Any changes detected, no matter how small, should be reported to a health care provider. Women in their 58s and 30s should have a clinical breast exam (CBE) by a health care provider as part of a regular health exam every 1 to 3 years. After age 16, women should have a CBE every year. Starting at age 53, women should consider having a mammogram (breast X-ray test) every year. Women who have a family history of breast cancer should talk to their health care provider about genetic screening. Women at a high risk of breast cancer should talk to their health care providers about having an MRI and a mammogram every year.  Breast cancer gene (BRCA)-related cancer risk assessment is recommended for women who have family members with BRCA-related cancers. BRCA-related cancers include breast, ovarian, tubal, and peritoneal cancers. Having family members with these cancers may be associated with an increased risk for harmful changes (mutations) in the breast cancer genes BRCA1 and BRCA2. Results of the assessment will determine the need for genetic counseling and  BRCA1 and BRCA2 testing.  Routine pelvic exams to screen for cancer are no longer recommended for nonpregnant women who are considered low risk for cancer of the pelvic organs (ovaries, uterus, and vagina) and who do not have symptoms. Ask your health care provider if a screening pelvic exam is right for you.  If you have had past treatment for cervical cancer or a condition that could lead to cancer, you need Pap tests and screening for cancer for at least 20 years after your treatment. If Pap tests have been discontinued, your risk factors (such as having a new sexual partner) need to be reassessed to determine if screening should be resumed. Some women have medical problems that increase the chance of getting cervical cancer. In these cases, your health care provider may recommend more frequent screening and Pap tests.  The HPV test is an additional test that may be used for cervical cancer screening. The HPV test looks for the virus that can cause the cell changes on the cervix. The cells collected during the Pap test can be  tested for HPV. The HPV test could be used to screen women aged 30 years and older, and should be used in women of any age who have unclear Pap test results. After the age of 30, women should have HPV testing at the same frequency as a Pap test.  Colorectal cancer can be detected and often prevented. Most routine colorectal cancer screening begins at the age of 50 years and continues through age 75 years. However, your health care provider may recommend screening at an earlier age if you have risk factors for colon cancer. On a yearly basis, your health care provider may provide home test kits to check for hidden blood in the stool. Use of a small camera at the end of a tube, to directly examine the colon (sigmoidoscopy or colonoscopy), can detect the earliest forms of colorectal cancer. Talk to your health care provider about this at age 50, when routine screening begins. Direct  exam of the colon should be repeated every 5-10 years through age 75 years, unless early forms of pre-cancerous polyps or small growths are found.  People who are at an increased risk for hepatitis B should be screened for this virus. You are considered at high risk for hepatitis B if:  You were born in a country where hepatitis B occurs often. Talk with your health care provider about which countries are considered high risk.  Your parents were born in a high-risk country and you have not received a shot to protect against hepatitis B (hepatitis B vaccine).  You have HIV or AIDS.  You use needles to inject street drugs.  You live with, or have sex with, someone who has hepatitis B.  You get hemodialysis treatment.  You take certain medicines for conditions like cancer, organ transplantation, and autoimmune conditions.  Hepatitis C blood testing is recommended for all people born from 1945 through 1965 and any individual with known risks for hepatitis C.  Practice safe sex. Use condoms and avoid high-risk sexual practices to reduce the spread of sexually transmitted infections (STIs). STIs include gonorrhea, chlamydia, syphilis, trichomonas, herpes, HPV, and human immunodeficiency virus (HIV). Herpes, HIV, and HPV are viral illnesses that have no cure. They can result in disability, cancer, and death.  You should be screened for sexually transmitted illnesses (STIs) including gonorrhea and chlamydia if:  You are sexually active and are younger than 24 years.  You are older than 24 years and your health care provider tells you that you are at risk for this type of infection.  Your sexual activity has changed since you were last screened and you are at an increased risk for chlamydia or gonorrhea. Ask your health care provider if you are at risk.  If you are at risk of being infected with HIV, it is recommended that you take a prescription medicine daily to prevent HIV infection. This is  called preexposure prophylaxis (PrEP). You are considered at risk if:  You are a heterosexual woman, are sexually active, and are at increased risk for HIV infection.  You take drugs by injection.  You are sexually active with a partner who has HIV.  Talk with your health care provider about whether you are at high risk of being infected with HIV. If you choose to begin PrEP, you should first be tested for HIV. You should then be tested every 3 months for as long as you are taking PrEP.  Osteoporosis is a disease in which the bones lose minerals and strength   with aging. This can result in serious bone fractures or breaks. The risk of osteoporosis can be identified using a bone density scan. Women ages 65 years and over and women at risk for fractures or osteoporosis should discuss screening with their health care providers. Ask your health care provider whether you should take a calcium supplement or vitamin D to reduce the rate of osteoporosis.  Menopause can be associated with physical symptoms and risks. Hormone replacement therapy is available to decrease symptoms and risks. You should talk to your health care provider about whether hormone replacement therapy is right for you.  Use sunscreen. Apply sunscreen liberally and repeatedly throughout the day. You should seek shade when your shadow is shorter than you. Protect yourself by wearing long sleeves, pants, a wide-brimmed hat, and sunglasses year round, whenever you are outdoors.  Once a month, do a whole body skin exam, using a mirror to look at the skin on your back. Tell your health care provider of new moles, moles that have irregular borders, moles that are larger than a pencil eraser, or moles that have changed in shape or color.  Stay current with required vaccines (immunizations).  Influenza vaccine. All adults should be immunized every year.  Tetanus, diphtheria, and acellular pertussis (Td, Tdap) vaccine. Pregnant women should  receive 1 dose of Tdap vaccine during each pregnancy. The dose should be obtained regardless of the length of time since the last dose. Immunization is preferred during the 27th-36th week of gestation. An adult who has not previously received Tdap or who does not know her vaccine status should receive 1 dose of Tdap. This initial dose should be followed by tetanus and diphtheria toxoids (Td) booster doses every 10 years. Adults with an unknown or incomplete history of completing a 3-dose immunization series with Td-containing vaccines should begin or complete a primary immunization series including a Tdap dose. Adults should receive a Td booster every 10 years.  Varicella vaccine. An adult without evidence of immunity to varicella should receive 2 doses or a second dose if she has previously received 1 dose. Pregnant females who do not have evidence of immunity should receive the first dose after pregnancy. This first dose should be obtained before leaving the health care facility. The second dose should be obtained 4-8 weeks after the first dose.  Human papillomavirus (HPV) vaccine. Females aged 13-26 years who have not received the vaccine previously should obtain the 3-dose series. The vaccine is not recommended for use in pregnant females. However, pregnancy testing is not needed before receiving a dose. If a female is found to be pregnant after receiving a dose, no treatment is needed. In that case, the remaining doses should be delayed until after the pregnancy. Immunization is recommended for any person with an immunocompromised condition through the age of 26 years if she did not get any or all doses earlier. During the 3-dose series, the second dose should be obtained 4-8 weeks after the first dose. The third dose should be obtained 24 weeks after the first dose and 16 weeks after the second dose.  Zoster vaccine. One dose is recommended for adults aged 60 years or older unless certain conditions are  present.  Measles, mumps, and rubella (MMR) vaccine. Adults born before 1957 generally are considered immune to measles and mumps. Adults born in 1957 or later should have 1 or more doses of MMR vaccine unless there is a contraindication to the vaccine or there is laboratory evidence of immunity to   each of the three diseases. A routine second dose of MMR vaccine should be obtained at least 28 days after the first dose for students attending postsecondary schools, health care workers, or international travelers. People who received inactivated measles vaccine or an unknown type of measles vaccine during 1963-1967 should receive 2 doses of MMR vaccine. People who received inactivated mumps vaccine or an unknown type of mumps vaccine before 1979 and are at high risk for mumps infection should consider immunization with 2 doses of MMR vaccine. For females of childbearing age, rubella immunity should be determined. If there is no evidence of immunity, females who are not pregnant should be vaccinated. If there is no evidence of immunity, females who are pregnant should delay immunization until after pregnancy. Unvaccinated health care workers born before 1957 who lack laboratory evidence of measles, mumps, or rubella immunity or laboratory confirmation of disease should consider measles and mumps immunization with 2 doses of MMR vaccine or rubella immunization with 1 dose of MMR vaccine.  Pneumococcal 13-valent conjugate (PCV13) vaccine. When indicated, a person who is uncertain of her immunization history and has no record of immunization should receive the PCV13 vaccine. An adult aged 19 years or older who has certain medical conditions and has not been previously immunized should receive 1 dose of PCV13 vaccine. This PCV13 should be followed with a dose of pneumococcal polysaccharide (PPSV23) vaccine. The PPSV23 vaccine dose should be obtained at least 8 weeks after the dose of PCV13 vaccine. An adult aged 19  years or older who has certain medical conditions and previously received 1 or more doses of PPSV23 vaccine should receive 1 dose of PCV13. The PCV13 vaccine dose should be obtained 1 or more years after the last PPSV23 vaccine dose.  Pneumococcal polysaccharide (PPSV23) vaccine. When PCV13 is also indicated, PCV13 should be obtained first. All adults aged 65 years and older should be immunized. An adult younger than age 65 years who has certain medical conditions should be immunized. Any person who resides in a nursing home or long-term care facility should be immunized. An adult smoker should be immunized. People with an immunocompromised condition and certain other conditions should receive both PCV13 and PPSV23 vaccines. People with human immunodeficiency virus (HIV) infection should be immunized as soon as possible after diagnosis. Immunization during chemotherapy or radiation therapy should be avoided. Routine use of PPSV23 vaccine is not recommended for American Indians, [place] Natives, or people younger than 65 years unless there are medical conditions that require PPSV23 vaccine. When indicated, people who have unknown immunization and have no record of immunization should receive PPSV23 vaccine. One-time revaccination 5 years after the first dose of PPSV23 is recommended for people aged 19-64 years who have chronic kidney failure, nephrotic syndrome, asplenia, or immunocompromised conditions. People who received 1-2 doses of PPSV23 before age 65 years should receive another dose of PPSV23 vaccine at age 65 years or later if at least 5 years have passed since the previous dose. Doses of PPSV23 are not needed for people immunized with PPSV23 at or after age 65 years.  Meningococcal vaccine. Adults with asplenia or persistent complement component deficiencies should receive 2 doses of quadrivalent meningococcal conjugate (MenACWY-D) vaccine. The doses should be obtained at least 2 months apart.  Microbiologists working with certain meningococcal bacteria, military recruits, people at risk during an outbreak, and people who travel to or live in countries with a high rate of meningitis should be immunized. A first-year college student up through age   21 years who is living in a residence hall should receive a dose if she did not receive a dose on or after her 16th birthday. Adults who have certain high-risk conditions should receive one or more doses of vaccine.  Hepatitis A vaccine. Adults who wish to be protected from this disease, have certain high-risk conditions, work with hepatitis A-infected animals, work in hepatitis A research labs, or travel to or work in countries with a high rate of hepatitis A should be immunized. Adults who were previously unvaccinated and who anticipate close contact with an international adoptee during the first 60 days after arrival in the [place] from a country with a high rate of hepatitis A should be immunized.  Hepatitis B vaccine. Adults who wish to be protected from this disease, have certain high-risk conditions, may be exposed to blood or other infectious body fluids, are household contacts or sex partners of hepatitis B positive people, are clients or workers in certain care facilities, or travel to or work in countries with a high rate of hepatitis B should be immunized.  Haemophilus influenzae type b (Hib) vaccine. A previously unvaccinated person with asplenia or sickle cell disease or having a scheduled splenectomy should receive 1 dose of Hib vaccine. Regardless of previous immunization, a recipient of a hematopoietic stem cell transplant should receive a 3-dose series 6-12 months after her successful transplant. Hib vaccine is not recommended for adults with HIV infection. Preventive Services / Frequency Ages 64 to 68 years  Blood pressure check.** / Every 1 to 2 years.  Lipid and cholesterol check.** / Every 5 years beginning at age  22.  Clinical breast exam.** / Every 3 years for women in their 88s and 53s.  BRCA-related cancer risk assessment.** / For women who have family members with a BRCA-related cancer (breast, ovarian, tubal, or peritoneal cancers).  Pap test.** / Every 2 years from ages 90 through 51. Every 3 years starting at age 21 through age 56 or 3 with a history of 3 consecutive normal Pap tests.  HPV screening.** / Every 3 years from ages 24 through ages 1 to 46 with a history of 3 consecutive normal Pap tests.  Hepatitis C blood test.** / For any individual with known risks for hepatitis C.  Skin self-exam. / Monthly.  Influenza vaccine. / Every year.  Tetanus, diphtheria, and acellular pertussis (Tdap, Td) vaccine.** / Consult your health care provider. Pregnant women should receive 1 dose of Tdap vaccine during each pregnancy. 1 dose of Td every 10 years.  Varicella vaccine.** / Consult your health care provider. Pregnant females who do not have evidence of immunity should receive the first dose after pregnancy.  HPV vaccine. / 3 doses over 6 months, if 72 and younger. The vaccine is not recommended for use in pregnant females. However, pregnancy testing is not needed before receiving a dose.  Measles, mumps, rubella (MMR) vaccine.** / You need at least 1 dose of MMR if you were born in 1957 or later. You may also need a 2nd dose. For females of childbearing age, rubella immunity should be determined. If there is no evidence of immunity, females who are not pregnant should be vaccinated. If there is no evidence of immunity, females who are pregnant should delay immunization until after pregnancy.  Pneumococcal 13-valent conjugate (PCV13) vaccine.** / Consult your health care provider.  Pneumococcal polysaccharide (PPSV23) vaccine.** / 1 to 2 doses if you smoke cigarettes or if you have certain conditions.  Meningococcal vaccine.** /  1 dose if you are age 19 to 21 years and a first-year college  student living in a residence hall, or have one of several medical conditions, you need to get vaccinated against meningococcal disease. You may also need additional booster doses.  Hepatitis A vaccine.** / Consult your health care provider.  Hepatitis B vaccine.** / Consult your health care provider.  Haemophilus influenzae type b (Hib) vaccine.** / Consult your health care provider. Ages 40 to 64 years  Blood pressure check.** / Every 1 to 2 years.  Lipid and cholesterol check.** / Every 5 years beginning at age 20 years.  Lung cancer screening. / Every year if you are aged 55-80 years and have a 30-pack-year history of smoking and currently smoke or have quit within the past 15 years. Yearly screening is stopped once you have quit smoking for at least 15 years or develop a health problem that would prevent you from having lung cancer treatment.  Clinical breast exam.** / Every year after age 40 years.  BRCA-related cancer risk assessment.** / For women who have family members with a BRCA-related cancer (breast, ovarian, tubal, or peritoneal cancers).  Mammogram.** / Every year beginning at age 40 years and continuing for as long as you are in good health. Consult with your health care provider.  Pap test.** / Every 3 years starting at age 30 years through age 65 or 70 years with a history of 3 consecutive normal Pap tests.  HPV screening.** / Every 3 years from ages 30 years through ages 65 to 70 years with a history of 3 consecutive normal Pap tests.  Fecal occult blood test (FOBT) of stool. / Every year beginning at age 50 years and continuing until age 75 years. You may not need to do this test if you get a colonoscopy every 10 years.  Flexible sigmoidoscopy or colonoscopy.** / Every 5 years for a flexible sigmoidoscopy or every 10 years for a colonoscopy beginning at age 50 years and continuing until age 75 years.  Hepatitis C blood test.** / For all people born from 1945 through  1965 and any individual with known risks for hepatitis C.  Skin self-exam. / Monthly.  Influenza vaccine. / Every year.  Tetanus, diphtheria, and acellular pertussis (Tdap/Td) vaccine.** / Consult your health care provider. Pregnant women should receive 1 dose of Tdap vaccine during each pregnancy. 1 dose of Td every 10 years.  Varicella vaccine.** / Consult your health care provider. Pregnant females who do not have evidence of immunity should receive the first dose after pregnancy.  Zoster vaccine.** / 1 dose for adults aged 60 years or older.  Measles, mumps, rubella (MMR) vaccine.** / You need at least 1 dose of MMR if you were born in 1957 or later. You may also need a 2nd dose. For females of childbearing age, rubella immunity should be determined. If there is no evidence of immunity, females who are not pregnant should be vaccinated. If there is no evidence of immunity, females who are pregnant should delay immunization until after pregnancy.  Pneumococcal 13-valent conjugate (PCV13) vaccine.** / Consult your health care provider.  Pneumococcal polysaccharide (PPSV23) vaccine.** / 1 to 2 doses if you smoke cigarettes or if you have certain conditions.  Meningococcal vaccine.** / Consult your health care provider.  Hepatitis A vaccine.** / Consult your health care provider.  Hepatitis B vaccine.** / Consult your health care provider.  Haemophilus influenzae type b (Hib) vaccine.** / Consult your health care provider. Ages 65   years and over  Blood pressure check.** / Every 1 to 2 years.  Lipid and cholesterol check.** / Every 5 years beginning at age 22 years.  Lung cancer screening. / Every year if you are aged 73-80 years and have a 30-pack-year history of smoking and currently smoke or have quit within the past 15 years. Yearly screening is stopped once you have quit smoking for at least 15 years or develop a health problem that would prevent you from having lung cancer  treatment.  Clinical breast exam.** / Every year after age 4 years.  BRCA-related cancer risk assessment.** / For women who have family members with a BRCA-related cancer (breast, ovarian, tubal, or peritoneal cancers).  Mammogram.** / Every year beginning at age 40 years and continuing for as long as you are in good health. Consult with your health care provider.  Pap test.** / Every 3 years starting at age 9 years through age 34 or 91 years with 3 consecutive normal Pap tests. Testing can be stopped between 65 and 70 years with 3 consecutive normal Pap tests and no abnormal Pap or HPV tests in the past 10 years.  HPV screening.** / Every 3 years from ages 57 years through ages 64 or 45 years with a history of 3 consecutive normal Pap tests. Testing can be stopped between 65 and 70 years with 3 consecutive normal Pap tests and no abnormal Pap or HPV tests in the past 10 years.  Fecal occult blood test (FOBT) of stool. / Every year beginning at age 15 years and continuing until age 17 years. You may not need to do this test if you get a colonoscopy every 10 years.  Flexible sigmoidoscopy or colonoscopy.** / Every 5 years for a flexible sigmoidoscopy or every 10 years for a colonoscopy beginning at age 86 years and continuing until age 71 years.  Hepatitis C blood test.** / For all people born from [birth year] through 1966 and any individual with known risks for hepatitis C.  Osteoporosis screening.** / A one-time screening for women ages 83 years and over and women at risk for fractures or osteoporosis.  Skin self-exam. / Monthly.  Influenza vaccine. / Every year.  Tetanus, diphtheria, and acellular pertussis (Tdap/Td) vaccine.** / 1 dose of Td every 10 years.  Varicella vaccine.** / Consult your health care provider.  Zoster vaccine.** / 1 dose for adults aged 61 years or older.  Pneumococcal 13-valent conjugate (PCV13) vaccine.** / Consult your health care provider.  Pneumococcal  polysaccharide (PPSV23) vaccine.** / 1 dose for all adults aged 28 years and older.  Meningococcal vaccine.** / Consult your health care provider.  Hepatitis A vaccine.** / Consult your health care provider.  Hepatitis B vaccine.** / Consult your health care provider.  Haemophilus influenzae type b (Hib) vaccine.** / Consult your health care provider. ** Family history and personal history of risk and conditions may change your health care provider's recommendations. Document Released: 12/09/2001 Document Revised: 02/27/2014 Document Reviewed: 03/10/2011 [facility] Patient Information 2015 [place], [place]. This information is not intended to replace advice given to you by your health care provider. Make sure you discuss any questions you have with your health care provider.

## 2014-07-27 NOTE — Progress Notes (Signed)
Subjective:     Sydney Thompson is a 53 y.o. female and is here for a comprehensive physical exam. The patient reports problems - multiple problems--- see a/p.  History   Social History  . Marital Status: Single    Spouse Name: N/A    Number of Children: N/A  . Years of Education: N/A   Occupational History  . 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 Smoking status: Never Smoker   . Smokeless tobacco: Not on file  . Alcohol Use: No  . Drug Use: No  . Sexual Activity: Yes    Partners: Female    Patent examiner Protection: None   Other Topics Concern  . Not on file   Social History Narrative   Exercise-- no   Health Maintenance  Topic Date Due  . Pap Smear  11/17/2014  . Mammogram  12/31/2014  . Influenza Vaccine  05/28/2015  . Colonoscopy  11/18/2019  . Tetanus/tdap  02/21/2020    The following portions of the patient's history were reviewed and updated as appropriate:  She  has a past medical history of Depression; GERD (gastroesophageal reflux disease); and IBS (irritable bowel syndrome). She  does not have any pertinent problems on file. She  has past surgical history that includes Cholecystectomy; Lithotripsy; and Gallbladder surgery. Her family history includes Alzheimer's disease in her mother; Cancer in her mother; Diabetes in her father, paternal uncle, paternal uncle, and paternal uncle; Heart disease in her father; Hyperlipidemia in her father; Stroke in her father. She  reports that she has never smoked. She does not have any smokeless tobacco history on file. She reports that she does not drink alcohol or use illicit drugs. She has a current medication list which includes the following prescription(s): desoximetasone and sertraline. Current Outpatient Prescriptions on File Prior to Visit  Medication Sig Dispense Refill  . sertraline (ZOLOFT) 100 MG tablet Take 1.5 tablets (150 mg total) by mouth daily.  135 tablet  3   No  current facility-administered medications on file prior to visit.   She has No Known Allergies..  Review of Systems Review of Systems  Constitutional: Negative for activity change, appetite change and fatigue.  HENT: Negative for hearing loss, congestion, tinnitus and ear discharge.  dentist q4mEyes: Negative for visual disturbance (see optho q1y -- vision corrected to 20/20 with glasses).  Respiratory: Negative for cough, chest tightness and shortness of breath.   Cardiovascular: Negative for chest pain, palpitations and leg swelling.  Gastrointestinal: Negative for abdominal pain, diarrhea, constipation and abdominal distention.  Genitourinary: Negative for urgency, frequency, decreased urine volume and difficulty urinating.  Musculoskeletal: Negative for back pain, arthralgias and gait problem.  Skin: Negative for color change, pallor and rash.  Neurological: Negative for dizziness, light-headedness, numbness and headaches.  Hematological: Negative for adenopathy. Does not bruise/bleed easily.  Psychiatric/Behavioral: Negative for suicidal ideas, confusion, sleep disturbance, self-injury, dysphoric mood, decreased concentration and agitation.       Objective:    BP 112/74  Pulse 80  Temp(Src) 98.1 F (36.7 C) (Oral)  Ht _0  (1.575 m)  Wt 235 lb 9.6 oz (106.867 kg)  BMI 43.08 kg/m2  SpO2 98% General appearance: alert, cooperative, appears stated age and no distress Head: Normocephalic, without obvious abnormality, atraumatic Eyes: negative findings: lids and lashes normal, conjunctivae and sclerae normal and pupils equal, round, reactive to light and accomodation Ears: normal TM's and external ear canals both ears Nose: Nares normal.  Septum midline. Mucosa normal. No drainage or sinus tenderness. Throat: lips, mucosa, and tongue normal; teeth and gums normal Neck: no adenopathy, no carotid bruit, no JVD, supple, symmetrical, trachea midline and thyroid not enlarged,  symmetric, no tenderness/mass/nodules Back: symmetric, no curvature. ROM normal. No CVA tenderness. Lungs: clear to auscultation bilaterally Breasts: normal appearance, no masses or tenderness Heart: regular rate and rhythm, S1, S2 normal, no murmur, click, rub or gallop Abdomen: soft, non-tender; bowel sounds normal; no masses,  no organomegaly Pelvic: not indicated; post-menopausal, no abnormal Pap smears in past Extremities: extremities normal, atraumatic, no cyanosis or edema-- numbness / tingling both hands. ,  Pain in wrists b/l, no swelling or pain with palpation Pulses: 2+ and symmetric Skin: Skin color, texture, turgor normal. No rashes or lesions Lymph nodes: Cervical, supraclavicular, and axillary nodes normal. Neurologic: Alert and oriented X 3, normal strength and tone. Normal symmetric reflexes. Normal coordination and gait Psych--no anxiety , no depression      Assessment:    Healthy female exam.      Plan:    ghm utd Check labs See After Visit Summary for Counseling Recommendations   1. Need for influenza vaccination  - Flu Vaccine QUAD 36+ mos PF IM (Fluarix Quad PF)  2. Eczema  - Desoximetasone (TOPICORT) 0.25 % ointment; Apply 1 application topically 2 (two) times daily.  Dispense: 30 g; Refill: 5  3. Myalgia and myositis  - Rheumatoid factor - ANA - Sed Rate (ESR) - Vitamin D (25 hydroxy)  4. Pain, joint, hand, unspecified laterality  - Rheumatoid factor - ANA - Sed Rate (ESR) - Vitamin D (25 hydroxy) - Ambulatory referral to Hand Surgery  5. Bilateral carpal tunnel syndrome  - Ambulatory referral to Hand Surgery  6. Estrogen deficiency  - DG Bone Density; Future  7. Encounter for breast cancer screening other than mammogram  - MM DIGITAL SCREENING BILATERAL; Future  8. Preventative health care   9. Need for prophylactic vaccination and inoculation against viral hepatitis  - Hepatitis A vaccine adult IM

## 2014-07-27 NOTE — Progress Notes (Signed)
Pre visit review using our clinic review tool, if applicable. No additional management support is needed unless otherwise documented below in the visit note. 

## 2014-07-28 ENCOUNTER — Telehealth: Payer: Self-pay | Admitting: Family Medicine

## 2014-07-28 NOTE — Telephone Encounter (Signed)
Cholesterol--- LDL goal < 100, HDL >40, TG < 150. Diet and exercise will increase HDL and decrease LDL and TG. Fish, Fish Oil, Flaxseed oil will also help increase the HDL and decrease Triglycerides. Recheck labs in 3 months---- why was crestor stopped? Pt needs a statin If no problems with crestor she should restart it 272.4 lipid, hep.

## 2014-07-28 NOTE — Telephone Encounter (Signed)
Caller name: Rogenia  Relation to pt: self  Call back number: [phone]   Reason for call: patient can not review lab results from mychart pt requesting the results be mailed. Please advise

## 2014-07-31 MED ORDER — ROSUVASTATIN CALCIUM 10 MG PO TABS: 10.0000 mg | ORAL_TABLET | Freq: Every day | ORAL | Status: DC

## 2014-07-31 NOTE — Telephone Encounter (Signed)
Notes Recorded by Ewing Schlein, CMA on 07/31/2014 at 8:55 AM Patient has been made aware of the results and recommendations and voiced understanding, she has agreed to restart the medication. Rx sent and copy mailed KP

## 2014-10-23 ENCOUNTER — Ambulatory Visit (INDEPENDENT_AMBULATORY_CARE_PROVIDER_SITE_OTHER): Payer: BC Managed Care – PPO | Admitting: Emergency Medicine

## 2014-10-23 VITALS — BP 118/68 | HR 86 | Temp 98.0°F | Resp 16 | Ht 63.0 in | Wt 236.0 lb

## 2014-10-23 DIAGNOSIS — Z111 Encounter for screening for respiratory tuberculosis: Secondary | ICD-10-CM

## 2014-10-23 NOTE — Progress Notes (Signed)

## 2014-10-23 NOTE — Progress Notes (Signed)
Patient here only for PPD

## 2014-10-25 ENCOUNTER — Ambulatory Visit (INDEPENDENT_AMBULATORY_CARE_PROVIDER_SITE_OTHER): Payer: BC Managed Care – PPO | Admitting: Radiology

## 2014-10-25 DIAGNOSIS — Z111 Encounter for screening for respiratory tuberculosis: Secondary | ICD-10-CM

## 2014-10-25 LAB — TB SKIN TEST
Induration: 0 mm
TB Skin Test: NEGATIVE

## 2014-11-02 ENCOUNTER — Telehealth: Payer: Self-pay | Admitting: Family Medicine

## 2014-11-02 NOTE — Telephone Encounter (Signed)
Referral done Auth # [id number]

## 2014-11-02 NOTE — Telephone Encounter (Signed)
Sydney Thompson from the [facility]  Dr. Daryll Brod NPI # [id number] DX: [phone] and [phone]

## 2014-11-02 NOTE — Telephone Encounter (Signed)
Caller name: Iasha, Mccalister Relation to pt: self  Call back number: [phone]   Reason for call:  Due to patient having new insurance pt is requesting a new referral for Dr. Daryll Brod. [facility] member ID# [id number]

## 2014-11-14 ENCOUNTER — Encounter: Payer: Self-pay | Admitting: Family Medicine

## 2014-12-14 ENCOUNTER — Other Ambulatory Visit: Payer: Self-pay | Admitting: Family Medicine

## 2015-01-15 ENCOUNTER — Telehealth: Payer: Self-pay | Admitting: Family Medicine

## 2015-01-15 DIAGNOSIS — E785 Hyperlipidemia, unspecified: Secondary | ICD-10-CM

## 2015-01-15 NOTE — Telephone Encounter (Signed)
Caller name:Milliner, Mardene Celeste Relation to XB:JYNW Call back number:[phone] Pharmacy:[facility]   Reason for call: pt states she need an order for labs in order to get her rx  crestor.

## 2015-01-15 NOTE — Telephone Encounter (Signed)
Orders are in. Please schedule .       KP

## 2015-01-16 NOTE — Telephone Encounter (Signed)
Left message for pt to call and schedule appt for labs

## 2015-01-17 ENCOUNTER — Other Ambulatory Visit (INDEPENDENT_AMBULATORY_CARE_PROVIDER_SITE_OTHER): Payer: 59

## 2015-01-17 DIAGNOSIS — E785 Hyperlipidemia, unspecified: Secondary | ICD-10-CM

## 2015-01-17 LAB — HEPATIC FUNCTION PANEL
ALBUMIN: 4.3 g/dL (ref 3.5–5.2)
ALK PHOS: 96 U/L (ref 39–117)
ALT: 47 U/L — AB (ref 0–35)
AST: 32 U/L (ref 0–37)
Bilirubin, Direct: 0.1 mg/dL (ref 0.0–0.3)
TOTAL PROTEIN: 7.7 g/dL (ref 6.0–8.3)
Total Bilirubin: 0.3 mg/dL (ref 0.2–1.2)

## 2015-01-17 LAB — LIPID PANEL
CHOL/HDL RATIO: 4
Cholesterol: 169 mg/dL (ref 0–200)
HDL: 42.1 mg/dL (ref >39.00)
NonHDL: 126.9
Triglycerides: 246 mg/dL — ABNORMAL HIGH (ref 0.0–149.0)
VLDL: 49.2 mg/dL — AB (ref 0.0–40.0)

## 2015-01-17 LAB — LDL CHOLESTEROL, DIRECT: Direct LDL: 103 mg/dL

## 2015-01-22 ENCOUNTER — Telehealth: Payer: Self-pay | Admitting: Family Medicine

## 2015-01-22 DIAGNOSIS — E785 Hyperlipidemia, unspecified: Secondary | ICD-10-CM

## 2015-01-22 MED ORDER — FENOFIBRATE 160 MG PO TABS: 160.0000 mg | ORAL_TABLET | Freq: Every day | ORAL | Status: DC

## 2015-01-22 NOTE — Telephone Encounter (Signed)
Notes Recorded by Rosalita Chessman, DO on 01/18/2015 at 4:51 PM Cholesterol--- LDL goal < 100, HDL >40, TG < 150. Diet and exercise will increase HDL and decrease LDL and TG. Fish, Fish Oil, Flaxseed oil will also help increase the HDL and decrease Triglycerides.  Recheck labs in 3 months-----add fenfibrate 160 mg #30 1 a day, 2 refills Lipid, hep---hypertriglyceridemia.

## 2015-01-22 NOTE — Telephone Encounter (Signed)
Patient has been made aware and verbalized understanding, she has agreed to take the Fenofibrate and I will mail a copy of labs because she does not know how to get into my-chart. Rx faxed.     KP

## 2015-01-22 NOTE — Telephone Encounter (Signed)
Caller name:Cobb, Mardene Celeste Relation to OF:VWAQ Call back [phone] Pharmacy:  Reason for call: pt would like to know if her blood work results are back, pt states they were done on 01/18/15

## 2015-01-26 ENCOUNTER — Other Ambulatory Visit: Payer: Self-pay | Admitting: Family Medicine

## 2015-03-08 ENCOUNTER — Encounter: Payer: Self-pay | Admitting: Internal Medicine

## 2015-04-16 ENCOUNTER — Other Ambulatory Visit: Payer: Self-pay | Admitting: Urology

## 2015-04-16 DIAGNOSIS — N281 Cyst of kidney, acquired: Secondary | ICD-10-CM

## 2015-05-01 ENCOUNTER — Telehealth: Payer: Self-pay | Admitting: Family Medicine

## 2015-05-01 DIAGNOSIS — H43399 Other vitreous opacities, unspecified eye: Secondary | ICD-10-CM

## 2015-05-01 DIAGNOSIS — Z0100 Encounter for examination of eyes and vision without abnormal findings: Secondary | ICD-10-CM

## 2015-05-01 NOTE — Telephone Encounter (Signed)
Caller name: Kanae Relation to pt: self Call back [phone] Pharmacy:  Reason for call:   Patient is requesting a referral to [facility]. Fax: [phone]. This is for her yearly but patient does have floaters and wants to get this checked. Patient states that she will schedule her own appt. Patient has uhc

## 2015-05-02 NOTE — Telephone Encounter (Signed)
Ref placed.      KP 

## 2015-05-07 ENCOUNTER — Ambulatory Visit (HOSPITAL_COMMUNITY)
Admission: RE | Admit: 2015-05-07 | Discharge: 2015-05-07 | Disposition: A | Discharge status: Home / Self Care | Payer: 59 | Source: Ambulatory Visit | Attending: Urology | Admitting: Urology

## 2015-05-07 DIAGNOSIS — Z9049 Acquired absence of other specified parts of digestive tract: Secondary | ICD-10-CM | POA: Insufficient documentation

## 2015-05-07 DIAGNOSIS — N2889 Other specified disorders of kidney and ureter: Secondary | ICD-10-CM | POA: Diagnosis present

## 2015-05-07 DIAGNOSIS — N281 Cyst of kidney, acquired: Secondary | ICD-10-CM | POA: Insufficient documentation

## 2015-05-07 DIAGNOSIS — K760 Fatty (change of) liver, not elsewhere classified: Secondary | ICD-10-CM | POA: Insufficient documentation

## 2015-05-07 LAB — POCT I-STAT CREATININE: CREATININE: 0.8 mg/dL (ref 0.44–1.00)

## 2015-05-07 MED ORDER — GADOBENATE DIMEGLUMINE 529 MG/ML IV SOLN
20.0000 mL | Freq: Once | INTRAVENOUS | Status: AC | PRN
  Administered 2015-05-07: 20 mL via INTRAVENOUS

## 2015-05-14 ENCOUNTER — Telehealth: Payer: Self-pay | Admitting: Family Medicine

## 2015-05-14 DIAGNOSIS — N281 Cyst of kidney, acquired: Secondary | ICD-10-CM

## 2015-05-14 NOTE — Telephone Encounter (Signed)
Relation to pt: self  Call back number: [phone]   Reason for call:  Patient states as per insurance requesting a referral to dr. Louis Meckel [facility]. Patient states insurance advised her the only way she can obtain her lab results taken at [facility]. Patient has an appointment 05/16/15. Please advise

## 2015-05-14 NOTE — Telephone Encounter (Signed)
Did she say what the referral was for?

## 2015-05-15 NOTE — Telephone Encounter (Signed)
Msg left to call the office. I need to get a Dx or the reason she had labs done, because we can not place the referral without a diagnosis code.     KP

## 2015-05-15 NOTE — Telephone Encounter (Signed)
As per patient she had lab work and urologist will not review results without an appointment. In order for patient to schedule appointment as per patient referral is needed

## 2015-05-15 NOTE — Telephone Encounter (Signed)
Msg left to call the office     KP 

## 2015-05-16 NOTE — Telephone Encounter (Signed)
Patient states that the dx is a cyst on her kidney. [facility] is watching the cyst to make sure it is not cancerous.

## 2015-05-28 ENCOUNTER — Other Ambulatory Visit: Payer: Self-pay | Admitting: Family Medicine

## 2015-06-28 ENCOUNTER — Other Ambulatory Visit: Payer: Self-pay

## 2015-06-28 DIAGNOSIS — F329 Major depressive disorder, single episode, unspecified: Secondary | ICD-10-CM

## 2015-06-28 DIAGNOSIS — F32A Depression, unspecified: Secondary | ICD-10-CM

## 2015-06-28 MED ORDER — SERTRALINE HCL 100 MG PO TABS: 150.0000 mg | ORAL_TABLET | Freq: Every day | ORAL | Status: DC

## 2015-07-25 ENCOUNTER — Other Ambulatory Visit: Payer: Self-pay | Admitting: Family Medicine

## 2015-09-19 ENCOUNTER — Telehealth: Payer: Self-pay | Admitting: Family Medicine

## 2015-09-19 MED ORDER — FENOFIBRATE 160 MG PO TABS: 160.0000 mg | ORAL_TABLET | Freq: Every day | ORAL | Status: DC

## 2015-09-19 NOTE — Telephone Encounter (Signed)
LM for pt to call and schedule f/u with fasting labs or CPE

## 2015-09-19 NOTE — Telephone Encounter (Signed)
30 sent only, please schedule the patient and apt with Fasting labs.       KP

## 2015-09-19 NOTE — Telephone Encounter (Signed)
Pt called for refill on fenofibrate. She is out. She said pharmacy told her to call and they requested it on Friday 09/14/15.  [id number] - [place], [place] - [address]

## 2015-10-11 ENCOUNTER — Other Ambulatory Visit: Payer: Self-pay | Admitting: Family Medicine

## 2015-10-23 ENCOUNTER — Ambulatory Visit (INDEPENDENT_AMBULATORY_CARE_PROVIDER_SITE_OTHER): Payer: 59 | Admitting: Family Medicine

## 2015-10-23 ENCOUNTER — Ambulatory Visit (HOSPITAL_BASED_OUTPATIENT_CLINIC_OR_DEPARTMENT_OTHER)
Admission: RE | Admit: 2015-10-23 | Discharge: 2015-10-23 | Disposition: A | Discharge status: Home / Self Care | Payer: 59 | Source: Ambulatory Visit | Attending: Family Medicine | Admitting: Family Medicine

## 2015-10-23 ENCOUNTER — Encounter: Payer: Self-pay | Admitting: Family Medicine

## 2015-10-23 ENCOUNTER — Telehealth: Payer: Self-pay | Admitting: Family Medicine

## 2015-10-23 VITALS — BP 110/70 | HR 65 | Temp 97.7°F | Ht 62.0 in | Wt 236.2 lb

## 2015-10-23 DIAGNOSIS — E785 Hyperlipidemia, unspecified: Secondary | ICD-10-CM | POA: Diagnosis not present

## 2015-10-23 DIAGNOSIS — F329 Major depressive disorder, single episode, unspecified: Secondary | ICD-10-CM | POA: Diagnosis not present

## 2015-10-23 DIAGNOSIS — F32A Depression, unspecified: Secondary | ICD-10-CM

## 2015-10-23 DIAGNOSIS — R0609 Other forms of dyspnea: Secondary | ICD-10-CM | POA: Diagnosis not present

## 2015-10-23 LAB — LIPID PANEL
CHOL/HDL RATIO: 3
Cholesterol: 133 mg/dL (ref 0–200)
HDL: 45.2 mg/dL (ref >39.00)
LDL Cholesterol: 56 mg/dL (ref 0–99)
NonHDL: 87.81
Triglycerides: 161 mg/dL — ABNORMAL HIGH (ref 0.0–149.0)
VLDL: 32.2 mg/dL (ref 0.0–40.0)

## 2015-10-23 LAB — COMPREHENSIVE METABOLIC PANEL
ALK PHOS: 67 U/L (ref 39–117)
ALT: 41 U/L — AB (ref 0–35)
AST: 37 U/L (ref 0–37)
Albumin: 4.2 g/dL (ref 3.5–5.2)
BILIRUBIN TOTAL: 0.3 mg/dL (ref 0.2–1.2)
BUN: 14 mg/dL (ref 6–23)
CO2: 25 meq/L (ref 19–32)
CREATININE: 0.79 mg/dL (ref 0.40–1.20)
Calcium: 9.6 mg/dL (ref 8.4–10.5)
Chloride: 105 mEq/L (ref 96–112)
GFR: 80.03 mL/min (ref >60.00)
GLUCOSE: 96 mg/dL (ref 70–99)
Potassium: 4 mEq/L (ref 3.5–5.1)
Sodium: 139 mEq/L (ref 135–145)
TOTAL PROTEIN: 7.3 g/dL (ref 6.0–8.3)

## 2015-10-23 MED ORDER — ROSUVASTATIN CALCIUM 10 MG PO TABS: 10.0000 mg | ORAL_TABLET | Freq: Every day | ORAL | Status: DC

## 2015-10-23 MED ORDER — SERTRALINE HCL 100 MG PO TABS: 150.0000 mg | ORAL_TABLET | Freq: Every day | ORAL | Status: DC

## 2015-10-23 MED ORDER — FENOFIBRATE 160 MG PO TABS: 160.0000 mg | ORAL_TABLET | Freq: Every day | ORAL | Status: DC

## 2015-10-23 NOTE — Telephone Encounter (Signed)
Caller name: Scarleth  Relationship to patient: Self   Can be reached: [phone]  Reason for call: Pt says that she is returning your call for results.

## 2015-10-23 NOTE — Progress Notes (Signed)
Pre visit review using our clinic review tool, if applicable. No additional management support is needed unless otherwise documented below in the visit note. 

## 2015-10-23 NOTE — Telephone Encounter (Signed)
Patient aware cxr normal.      KP

## 2015-10-23 NOTE — Patient Instructions (Signed)

## 2015-10-23 NOTE — Progress Notes (Signed)
Patient ID: Sydney Thompson, female    DOB: [birth year]  Age: 54 y.o. MRN: [id number]    Subjective:  Subjective HPI Sydney Thompson presents for f/u depression and cholesterol.  She is also c/o new DOE.  No chest pain-- symptoms x last few months.  Never at rest.   Review of Systems  Constitutional: Negative for diaphoresis, appetite change, fatigue and unexpected weight change.  Eyes: Negative for pain, redness and visual disturbance.  Respiratory: Positive for shortness of breath. Negative for cough, chest tightness and wheezing.   Cardiovascular: Negative for chest pain, palpitations and leg swelling.  Endocrine: Negative for cold intolerance, heat intolerance, polydipsia, polyphagia and polyuria.  Genitourinary: Negative for dysuria, frequency and difficulty urinating.  Neurological: Negative for dizziness, light-headedness, numbness and headaches.    History Past Medical History  Diagnosis Date  . Depression   . GERD (gastroesophageal reflux disease)   . IBS (irritable bowel syndrome)     She has past surgical history that includes Cholecystectomy; Lithotripsy; and Gallbladder surgery.   Her family history includes Alzheimer's disease in her mother; Cancer in her mother; Diabetes in her father, paternal uncle, paternal uncle, and paternal uncle; Heart disease in her father; Hyperlipidemia in her father; Stroke in her father.She reports that she has never smoked. She does not have any smokeless tobacco history on file. She reports that she does not drink alcohol or use illicit drugs.  Current Outpatient Prescriptions on File Prior to Visit  Medication Sig Dispense Refill  . Desoximetasone (TOPICORT) 0.25 % ointment Apply 1 application topically 2 (two) times daily. (Patient not taking: Reported on 10/23/2015) 30 g 5   No current facility-administered medications on file prior to visit.     Objective:  Objective Physical Exam  Constitutional: She is oriented to person,  place, and time. She appears well-developed and well-nourished.  HENT:  Head: Normocephalic and atraumatic.  Eyes: Conjunctivae and EOM are normal.  Neck: Normal range of motion. Neck supple. No JVD present. Carotid bruit is not present. No thyromegaly present.  Cardiovascular: Normal rate, regular rhythm and normal heart sounds.   No murmur heard. Pulmonary/Chest: Effort normal and breath sounds normal. No respiratory distress. She has no wheezes. She has no rales. She exhibits no tenderness.  Musculoskeletal: She exhibits no edema.  Neurological: She is alert and oriented to person, place, and time.  Psychiatric: She has a normal mood and affect. Her behavior is normal. Judgment and thought content normal.  Nursing note and vitals reviewed.  BP 110/70 mmHg  Pulse 65  Temp(Src) 97.7 F (36.5 C) (Oral)  Ht '5\' 2"'$  (1.575 m)  Wt 236 lb 3.2 oz (107.14 kg)  BMI 43.19 kg/m2  SpO2 99% Wt Readings from Last 3 Encounters:  10/23/15 236 lb 3.2 oz (107.14 kg)  05/07/15 235 lb (106.595 kg)  10/23/14 236 lb (107.049 kg)     Lab Results  Component Value Date   WBC 7.7 07/20/2014   HGB 12.9 07/20/2014   HCT 38.6 07/20/2014   PLT 202.0 07/20/2014   GLUCOSE 99 07/20/2014   CHOL 169 01/17/2015   TRIG 246.0* 01/17/2015   HDL 42.10 01/17/2015   LDLDIRECT 103.0 01/17/2015   LDLCALC 76 02/27/2011   ALT 47* 01/17/2015   AST 32 01/17/2015   NA 137 07/20/2014   K 4.1 07/20/2014   CL 104 07/20/2014   CREATININE 0.80 05/07/2015   BUN 10 07/20/2014   CO2 26 07/20/2014   TSH 1.54 07/20/2014   INR 1.0 07/17/2009  HGBA1C 5.7 02/20/2010    Mr Abdomen W Wo Contrast  05/07/2015  CLINICAL DATA:  Followup renal mass. EXAM: MRI ABDOMEN WITHOUT AND WITH CONTRAST TECHNIQUE: Multiplanar multisequence MR imaging of the abdomen was performed both before and after the administration of intravenous contrast. CONTRAST:  1m MULTIHANCE GADOBENATE DIMEGLUMINE 529 MG/ML IV SOLN COMPARISON:  05/05/2014  FINDINGS: Lower chest:  There is no pleural or pericardial effusion. Hepatobiliary: No focal liver abnormality identified. Diffuse hepatic steatosis noted. Previous cholecystectomy. No biliary dilatation. Pancreas: Normal appearance of the pancreas. Spleen: Normal appearance of the spleen. Adrenals/Urinary Tract: The adrenal glands are both normal. Bilateral renal cysts are noted peer hemorrhagic cyst arising from the mid right kidney measures 1.3 cm and is unchanged compared with previous exam from 02/24/2013. Cysts with partial hemorrhagic component arising from the inferior pole of the left kidney measures 2.3 cm, image 92 of series 1101. This is increased from 1.7 cm previously. Stable a hemorrhagic cyst within the mid left kidney measuring 9 mm, image 7 75 of series 1100. Exophytic lesion arising from the upper pole of the right kidney without enhancement measure 1 cm and is stable from previous exam. Stomach/Bowel: The stomach and small bowel loops are unremarkable. The visualized portions of the colon are normal. Vascular/Lymphatic: Normal caliber of the abdominal aorta. No enlarged upper abdominal lymph nodes. Other: There is no free fluid or fluid collections identified within the upper abdomen. Musculoskeletal: Normal signal from within the bone marrow. IMPRESSION: 1. Bilateral renal cysts of varying complexity are identified. The indeterminate lesion in inferior pole of left kidney is unchanged when compared with 02/24/2013 and is compatible with a Bosniak category 2 lesion. 2. The previously described Bosniak category 2 lesion involving the inferior pole of the right kidney is slightly increased in size from previous exam measuring 2.3 cm currently versus 1.7 cm previously. No enhancing component associated with this structure. 3. Hepatic steatosis. 4. Previous cholecystectomy. Electronically Signed   By: TKerby MoorsM.D.   On: 05/07/2015 17:42     Assessment & Plan:  Plan I have changed Ms.  Peaden's rosuvastatin and sertraline. I am also having her maintain her Desoximetasone and fenofibrate.  Meds ordered this encounter  Medications  . fenofibrate 160 MG tablet    Sig: Take 1 tablet (160 mg total) by mouth daily.    Dispense:  30 tablet    Refill:  5  . rosuvastatin (CRESTOR) 10 MG tablet    Sig: Take 1 tablet (10 mg total) by mouth daily.    Dispense:  30 tablet    Refill:  5  . sertraline (ZOLOFT) 100 MG tablet    Sig: Take 1.5 tablets (150 mg total) by mouth daily.    Dispense:  135 tablet    Refill:  1    Problem List Items Addressed This Visit    None    Visit Diagnoses    Depression    -  Primary    Relevant Medications    sertraline (ZOLOFT) 100 MG tablet    Hyperlipidemia        Relevant Medications    fenofibrate 160 MG tablet    rosuvastatin (CRESTOR) 10 MG tablet    Other Relevant Orders    Comp Met (CMET)    Lipid panel    DOE (dyspnea on exertion)        Relevant Orders    EKG 12-Lead    DG Chest 2 View (Completed)    ECHOCARDIOGRAM COMPLETE  Follow-up: Return in about 6 months (around 04/22/2016), or if symptoms worsen or fail to improve, for hypertension, hyperlipidemia, annual exam, fasting.  Garnet Koyanagi, DO

## 2015-10-31 ENCOUNTER — Ambulatory Visit (HOSPITAL_BASED_OUTPATIENT_CLINIC_OR_DEPARTMENT_OTHER)
Admission: RE | Admit: 2015-10-31 | Discharge: 2015-10-31 | Disposition: A | Discharge status: Home / Self Care | Payer: BLUE CROSS/BLUE SHIELD | Source: Ambulatory Visit | Attending: Family Medicine | Admitting: Family Medicine

## 2015-10-31 DIAGNOSIS — R0609 Other forms of dyspnea: Secondary | ICD-10-CM

## 2015-10-31 DIAGNOSIS — I517 Cardiomegaly: Secondary | ICD-10-CM | POA: Insufficient documentation

## 2015-10-31 DIAGNOSIS — R0600 Dyspnea, unspecified: Secondary | ICD-10-CM | POA: Diagnosis present

## 2015-10-31 NOTE — Progress Notes (Signed)
Echocardiogram 2D Echocardiogram has been performed.  Sydney Thompson 10/31/2015, 11:17 AM

## 2015-11-02 ENCOUNTER — Telehealth: Payer: Self-pay | Admitting: Family Medicine

## 2015-11-02 NOTE — Telephone Encounter (Signed)
Relation to pt: self Call back [phone]   Reason for call:  Patient inquiring about Echo results

## 2015-11-02 NOTE — Telephone Encounter (Signed)
Notes Recorded by Rosalita Chessman, DO on 11/01/2015 at 9:18 PM normal   Patient has been made aware and verbalized understanding, she will call if symptoms persist.    KP

## 2015-11-14 ENCOUNTER — Other Ambulatory Visit: Payer: Self-pay | Admitting: Family Medicine

## 2015-11-22 ENCOUNTER — Telehealth: Payer: Self-pay | Admitting: Family Medicine

## 2015-11-22 NOTE — Telephone Encounter (Signed)
Message left to call the office.    KP 

## 2015-11-22 NOTE — Telephone Encounter (Signed)
Please review and advise     KP 

## 2015-11-22 NOTE — Telephone Encounter (Signed)
Caller name: Self  Can be reached: [phone]   Reason for call: Patient called stating that she no longer has insurance and can not afford to pay for her medications. Wants to know if there are any programs that will assist her in paying for medicine or if her rx's can be changed to something less expensive. Plse adv

## 2015-11-22 NOTE — Telephone Encounter (Signed)
Change crestor to lovastain 20 mg 1 po qhs---not as strong but on $4 list at [facility] and [facility]---- don't think there is anything to sub for rest

## 2015-11-23 MED ORDER — LOVASTATIN 20 MG PO TABS: 20.0000 mg | ORAL_TABLET | Freq: Every day | ORAL | Status: DC

## 2015-11-23 NOTE — Telephone Encounter (Signed)
Discussed with patient and she verbalized understanding, she requested the Lovastatin to be sent to [facility] on [address]. I advised she can check Rx prices at [facility] and if they are reasonable, I can send all of the med's she voiced understanding and agreed to call back with details.    KP

## 2015-11-23 NOTE — Addendum Note (Signed)
Addended by: Ewing Schlein on: 11/23/2015 03:27 PM   Modules accepted: Orders

## 2016-09-04 ENCOUNTER — Other Ambulatory Visit: Payer: Self-pay | Admitting: Family Medicine

## 2016-09-05 NOTE — Telephone Encounter (Signed)
Not sure why this is routed to me. Will send to PCP.

## 2016-11-28 ENCOUNTER — Telehealth: Payer: Self-pay | Admitting: Family Medicine

## 2016-11-28 DIAGNOSIS — M2550 Pain in unspecified joint: Secondary | ICD-10-CM

## 2016-11-28 NOTE — Telephone Encounter (Signed)
Left message on machine that we made referral and to call back with any questions and concerns

## 2016-11-28 NOTE — Telephone Encounter (Signed)
Ok to refer to Dr Amil Amen -- rheumatology

## 2016-11-28 NOTE — Telephone Encounter (Signed)
Pt says that she is experiencing some joint pain. She said that she has seen provider for her concern before. Pt would like a referral to a Rheumatologist in the [place] or [employer] area if possible.

## 2017-01-02 ENCOUNTER — Other Ambulatory Visit: Payer: Self-pay | Admitting: Family Medicine

## 2017-01-02 DIAGNOSIS — E785 Hyperlipidemia, unspecified: Secondary | ICD-10-CM

## 2017-01-19 ENCOUNTER — Other Ambulatory Visit: Payer: Self-pay | Admitting: Family Medicine

## 2017-01-19 ENCOUNTER — Ambulatory Visit (INDEPENDENT_AMBULATORY_CARE_PROVIDER_SITE_OTHER): Payer: Self-pay | Admitting: Family Medicine

## 2017-01-19 ENCOUNTER — Encounter: Payer: Self-pay | Admitting: Family Medicine

## 2017-01-19 ENCOUNTER — Ambulatory Visit (HOSPITAL_BASED_OUTPATIENT_CLINIC_OR_DEPARTMENT_OTHER)
Admission: RE | Admit: 2017-01-19 | Discharge: 2017-01-19 | Disposition: A | Discharge status: Home / Self Care | Payer: Self-pay | Source: Ambulatory Visit | Attending: Family Medicine | Admitting: Family Medicine

## 2017-01-19 VITALS — BP 110/68 | HR 81 | Temp 97.8°F | Resp 18 | Ht 62.0 in | Wt 244.4 lb

## 2017-01-19 DIAGNOSIS — M79642 Pain in left hand: Principal | ICD-10-CM

## 2017-01-19 DIAGNOSIS — Z0000 Encounter for general adult medical examination without abnormal findings: Secondary | ICD-10-CM | POA: Insufficient documentation

## 2017-01-19 DIAGNOSIS — R8299 Other abnormal findings in urine: Secondary | ICD-10-CM

## 2017-01-19 DIAGNOSIS — M79641 Pain in right hand: Secondary | ICD-10-CM

## 2017-01-19 DIAGNOSIS — M85842 Other specified disorders of bone density and structure, left hand: Secondary | ICD-10-CM | POA: Insufficient documentation

## 2017-01-19 DIAGNOSIS — R82998 Other abnormal findings in urine: Secondary | ICD-10-CM

## 2017-01-19 DIAGNOSIS — R52 Pain, unspecified: Secondary | ICD-10-CM

## 2017-01-19 DIAGNOSIS — F325 Major depressive disorder, single episode, in full remission: Secondary | ICD-10-CM

## 2017-01-19 DIAGNOSIS — E785 Hyperlipidemia, unspecified: Secondary | ICD-10-CM

## 2017-01-19 DIAGNOSIS — E782 Mixed hyperlipidemia: Secondary | ICD-10-CM

## 2017-01-19 LAB — COMPREHENSIVE METABOLIC PANEL
ALBUMIN: 4.6 g/dL (ref 3.5–5.2)
ALK PHOS: 55 U/L (ref 39–117)
ALT: 20 U/L (ref 0–35)
AST: 25 U/L (ref 0–37)
BUN: 18 mg/dL (ref 6–23)
CO2: 23 mEq/L (ref 19–32)
CREATININE: 0.8 mg/dL (ref 0.40–1.20)
Calcium: 9.4 mg/dL (ref 8.4–10.5)
Chloride: 105 mEq/L (ref 96–112)
GFR: 78.52 mL/min (ref >60.00)
GLUCOSE: 102 mg/dL — AB (ref 70–99)
Potassium: 4.1 mEq/L (ref 3.5–5.1)
SODIUM: 137 meq/L (ref 135–145)
TOTAL PROTEIN: 8.1 g/dL (ref 6.0–8.3)
Total Bilirubin: 0.3 mg/dL (ref 0.2–1.2)

## 2017-01-19 LAB — CBC WITH DIFFERENTIAL/PLATELET
Basophils Absolute: 0 10*3/uL (ref 0.0–0.1)
Basophils Relative: 0.6 % (ref 0.0–3.0)
EOS ABS: 0.1 10*3/uL (ref 0.0–0.7)
Eosinophils Relative: 1.8 % (ref 0.0–5.0)
HCT: 39.5 % (ref 36.0–46.0)
HEMOGLOBIN: 13.1 g/dL (ref 12.0–15.0)
LYMPHS ABS: 1.4 10*3/uL (ref 0.7–4.0)
Lymphocytes Relative: 18.3 % (ref 12.0–46.0)
MCHC: 33.3 g/dL (ref 30.0–36.0)
MCV: 94.1 fl (ref 78.0–100.0)
Monocytes Absolute: 0.6 10*3/uL (ref 0.1–1.0)
Monocytes Relative: 7.3 % (ref 3.0–12.0)
NEUTROS PCT: 72 % (ref 43.0–77.0)
Neutro Abs: 5.6 10*3/uL (ref 1.4–7.7)
Platelets: 259 10*3/uL (ref 150.0–400.0)
RBC: 4.2 Mil/uL (ref 3.87–5.11)
RDW: 13.7 % (ref 11.5–15.5)
WBC: 7.8 10*3/uL (ref 4.0–10.5)

## 2017-01-19 LAB — POC URINALSYSI DIPSTICK (AUTOMATED)
BILIRUBIN UA: NEGATIVE
Blood, UA: NEGATIVE
Glucose, UA: NEGATIVE
Ketones, UA: NEGATIVE
NITRITE UA: NEGATIVE
PH UA: 6 (ref 5.0–8.0)
Spec Grav, UA: 1.03 (ref 1.030–1.035)
UROBILINOGEN UA: 0.2 (ref ?–2.0)

## 2017-01-19 LAB — LIPID PANEL
CHOLESTEROL: 161 mg/dL (ref 0–200)
HDL: 43.4 mg/dL (ref >39.00)
LDL Cholesterol: 80 mg/dL (ref 0–99)
NONHDL: 117.71
TRIGLYCERIDES: 187 mg/dL — AB (ref 0.0–149.0)
Total CHOL/HDL Ratio: 4
VLDL: 37.4 mg/dL (ref 0.0–40.0)

## 2017-01-19 LAB — TSH: TSH: 4.23 u[IU]/mL (ref 0.35–4.50)

## 2017-01-19 MED ORDER — ZOSTER VAC RECOMB ADJUVANTED 50 MCG/0.5ML IM SUSR: 0.5000 mL | Freq: Once | INTRAMUSCULAR | 1 refills | Status: AC

## 2017-01-19 NOTE — Assessment & Plan Note (Signed)
Tolerating statin, encouraged heart healthy diet, avoid trans fats, minimize simple carbs and saturated fats. Increase exercise as tolerated 

## 2017-01-19 NOTE — Patient Instructions (Signed)

## 2017-01-19 NOTE — Progress Notes (Signed)
Pre visit review using our clinic review tool, if applicable. No additional management support is needed unless otherwise documented below in the visit note. 

## 2017-01-19 NOTE — Assessment & Plan Note (Signed)
ghm utd Check labs con't meds  See avs

## 2017-01-19 NOTE — Progress Notes (Signed)
Subjective:  I acted as a Education administrator for Dr. Royden Purl, LPN    Patient ID: Sydney Thompson, female    DOB: [birth year], 56 y.o.   MRN: [id number]  Chief Complaint  Patient presents with  . Annual Exam  . Hyperlipidemia    follow up    Hyperlipidemia  This is a chronic problem. The current episode started more than 1 year ago. The problem is controlled. Pertinent negatives include no chest pain.    Patient is in today for annual physical, follow up hyperlipidemia. Patient report she still have pain in both hands. Patient was seen 3 years ago by the [facility] and was told she has tendonitis.     Patient Care Team: Ann Held, DO as PCP - General   Past Medical History:  Diagnosis Date  . Depression   . GERD (gastroesophageal reflux disease)   . IBS (irritable bowel syndrome)     Past Surgical History:  Procedure Laterality Date  . CHOLECYSTECTOMY    . GALLBLADDER SURGERY    . LITHOTRIPSY     Dr. Terance Hart    Family History  Problem Relation Age of Onset  . Cancer Mother     breast  . Alzheimer's disease Mother   . Stroke Father   . Heart disease Father     MI  . Hyperlipidemia Father   . Diabetes Father   . Diabetes Paternal Uncle   . Diabetes Paternal Uncle   . Diabetes Paternal Uncle     Social History   Social History  . Marital status: Single    Spouse name: N/A  . Number of children: N/A  . Years of education: N/A   Occupational History  . 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 Smoking status: Never Smoker  . Smokeless tobacco: Not on file  . Alcohol use No  . Drug use: No  . Sexual activity: Yes    Partners: Female    Birth control/ protection: None   Other Topics Concern  . Not on file   Social History Narrative   Exercise-- no    Outpatient Medications Prior to Visit  Medication Sig Dispense Refill  . fenofibrate 160 MG tablet TAKE ONE TABLET BY MOUTH ONCE DAILY 30 tablet 4  . lovastatin  (MEVACOR) 20 MG tablet TAKE ONE TABLET BY MOUTH AT BEDTIME 30 tablet 5  . sertraline (ZOLOFT) 100 MG tablet TAKE ONE & ONE-HALF TABLETS BY MOUTH ONCE DAILY 135 tablet 1  . Desoximetasone (TOPICORT) 0.25 % ointment Apply 1 application topically 2 (two) times daily. 30 g 5   No facility-administered medications prior to visit.     No Known Allergies  Review of Systems  Constitutional: Negative for fever.  HENT: Negative for congestion.   Eyes: Negative for blurred vision.  Respiratory: Negative for cough.   Cardiovascular: Negative for chest pain and palpitations.  Gastrointestinal: Negative for vomiting.  Musculoskeletal: Negative for back pain.  Skin: Negative for rash.  Neurological: Negative for loss of consciousness and headaches.       Objective:    Physical Exam  Constitutional: She is oriented to person, place, and time. She appears well-developed and well-nourished. No distress.  HENT:  Head: Normocephalic and atraumatic.  Right Ear: External ear normal.  Left Ear: External ear normal.  Nose: Nose normal.  Mouth/Throat: Oropharynx is clear and moist.  Eyes: Conjunctivae and EOM are normal. Pupils are equal, round, and reactive to  light.  Neck: Normal range of motion. Neck supple. No thyromegaly present.  Cardiovascular: Normal rate, regular rhythm and normal heart sounds.   No murmur heard. Pulmonary/Chest: Effort normal and breath sounds normal. No respiratory distress. She has no wheezes. She has no rales. She exhibits no tenderness.  Abdominal: Soft. Bowel sounds are normal. There is no tenderness.  Musculoskeletal: Normal range of motion. She exhibits edema, tenderness and deformity.  Both hands-- pain and deformity in fingers   Neurological: She is alert and oriented to person, place, and time.  Skin: Skin is warm and dry. She is not diaphoretic.  Psychiatric: She has a normal mood and affect. Her behavior is normal. Judgment and thought content normal.  Nursing  note and vitals reviewed.   BP 110/68 (BP Location: Right Arm, Patient Position: Sitting, Cuff Size: Large)   Pulse 81   Temp 97.8 F (36.6 C) (Oral)   Resp 18   Ht 5\' 2"  (1.575 m)   Wt 244 lb 6.4 oz (110.9 kg)   SpO2 96%   BMI 44.70 kg/m  Wt Readings from Last 3 Encounters:  01/19/17 244 lb 6.4 oz (110.9 kg)  10/23/15 236 lb 3.2 oz (107.1 kg)  05/07/15 235 lb (106.6 kg)   BP Readings from Last 3 Encounters:  01/19/17 110/68  10/23/15 110/70  10/23/14 118/68     Immunization History  Administered Date(s) Administered  . Hepatitis A 02/20/2010  . Hepatitis A, Adult 07/27/2014  . Influenza Split 08/18/2012, 08/31/2015  . Influenza Whole 08/08/2009, 02/20/2010  . Influenza,inj,Quad PF,36+ Mos 10/29/2013, 07/27/2014  . PPD Test 10/29/2013, 10/23/2014  . Td 02/20/2010    Health Maintenance  Topic Date Due  . INFLUENZA VACCINE  01/19/2018 (Originally 05/27/2016)  . PAP SMEAR  01/19/2018 (Originally 11/17/2014)  . Hepatitis C Screening  01/19/2018 (Originally 02-Sep-1961)  . HIV Screening  01/19/2018 (Originally 11/06/1974)  . MAMMOGRAM  10/16/2017  . TETANUS/TDAP  02/21/2020  . COLONOSCOPY  05/02/2020    Lab Results  Component Value Date   WBC 7.7 07/20/2014   HGB 12.9 07/20/2014   HCT 38.6 07/20/2014   PLT 202.0 07/20/2014   GLUCOSE 96 10/23/2015   CHOL 133 10/23/2015   TRIG 161.0 (H) 10/23/2015   HDL 45.20 10/23/2015   LDLDIRECT 103.0 01/17/2015   LDLCALC 56 10/23/2015   ALT 41 (H) 10/23/2015   AST 37 10/23/2015   NA 139 10/23/2015   K 4.0 10/23/2015   CL 105 10/23/2015   CREATININE 0.79 10/23/2015   BUN 14 10/23/2015   CO2 25 10/23/2015   TSH 1.54 07/20/2014   INR 1.0 07/17/2009   HGBA1C 5.7 02/20/2010    Lab Results  Component Value Date   TSH 1.54 07/20/2014   Lab Results  Component Value Date   WBC 7.7 07/20/2014   HGB 12.9 07/20/2014   HCT 38.6 07/20/2014   MCV 93.5 07/20/2014   PLT 202.0 07/20/2014   Lab Results  Component Value Date    NA 139 10/23/2015   K 4.0 10/23/2015   CO2 25 10/23/2015   GLUCOSE 96 10/23/2015   BUN 14 10/23/2015   CREATININE 0.79 10/23/2015   BILITOT 0.3 10/23/2015   ALKPHOS 67 10/23/2015   AST 37 10/23/2015   ALT 41 (H) 10/23/2015   PROT 7.3 10/23/2015   ALBUMIN 4.2 10/23/2015   CALCIUM 9.6 10/23/2015   GFR 80.03 10/23/2015   Lab Results  Component Value Date   CHOL 133 10/23/2015   Lab Results  Component Value  Date   HDL 45.20 10/23/2015   Lab Results  Component Value Date   LDLCALC 56 10/23/2015   Lab Results  Component Value Date   TRIG 161.0 (H) 10/23/2015   Lab Results  Component Value Date   CHOLHDL 3 10/23/2015   Lab Results  Component Value Date   HGBA1C 5.7 02/20/2010         Assessment & Plan:   Problem List Items Addressed This Visit      Unprioritized   Depression, major, single episode, complete remission ([place])    con't zoloft      Hyperlipidemia LDL goal <100    Tolerating statin, encouraged heart healthy diet, avoid trans fats, minimize simple carbs and saturated fats. Increase exercise as tolerated      Preventative health care - Primary    ghm utd Check labs con't meds  See avs      Relevant Orders   Comprehensive metabolic panel   CBC with Differential/Platelet   TSH   POCT Urinalysis Dipstick (Automated)    Other Visit Diagnoses    Mixed hyperlipidemia       Relevant Orders   Lipid panel   POCT Urinalysis Dipstick (Automated)   Pain in both hands       Relevant Orders   DG HAND 2 VIEWS BILAT   Ambulatory referral to Hand Surgery      I have discontinued Ms. Dziedzic's Desoximetasone. I am also having her start on Zoster Vac Recomb Adjuvanted. Additionally, I am having her maintain her sertraline, fenofibrate, and lovastatin.  Meds ordered this encounter  Medications  . Zoster Vac Recomb Adjuvanted [facility]) injection    Sig: Inject 0.5 mLs into the muscle once.    Dispense:  0.5 mL    Refill:  1    CMA served as  scribe during this visit. History, Physical and Plan performed by medical provider. Documentation and orders reviewed and attested to.  Ann Held, DO   Patient ID: Sydney Thompson, female   DOB: Sep 30, 1961, 56 y.o.   MRN: [id number]

## 2017-01-19 NOTE — Assessment & Plan Note (Signed)
con't zoloft

## 2017-01-20 ENCOUNTER — Other Ambulatory Visit: Payer: Self-pay | Admitting: Family Medicine

## 2017-01-20 ENCOUNTER — Telehealth: Payer: Self-pay | Admitting: Family Medicine

## 2017-01-20 DIAGNOSIS — E785 Hyperlipidemia, unspecified: Secondary | ICD-10-CM

## 2017-01-20 LAB — URINE CULTURE

## 2017-01-20 NOTE — Telephone Encounter (Signed)
Pt is returning call for results 

## 2017-03-20 ENCOUNTER — Other Ambulatory Visit: Payer: Self-pay | Admitting: Family Medicine

## 2017-03-20 DIAGNOSIS — E785 Hyperlipidemia, unspecified: Secondary | ICD-10-CM

## 2017-04-08 ENCOUNTER — Other Ambulatory Visit: Payer: Self-pay | Admitting: Family Medicine

## 2017-05-25 ENCOUNTER — Other Ambulatory Visit: Payer: Self-pay | Admitting: Family Medicine

## 2017-05-25 MED ORDER — NYSTATIN-TRIAMCINOLONE 100000-0.1 UNIT/GM-% EX CREA: 1.0000 "application " | TOPICAL_CREAM | Freq: Two times a day (BID) | CUTANEOUS | 1 refills | Status: DC

## 2017-05-25 NOTE — Telephone Encounter (Signed)
Caller name: [place]  Relation to pt: self  Call back [place]: [place] ([place]), [place]  Reason for call: Pt is needing refill for Nystatin cream USP (100,000 units) mixed with Triamcinolone Acetonide Cream USP, 0.1% pt states Rheumatologist gives her this rx but that rheumatologist states that she can get her rx from her primary doctor. Please advise.

## 2017-05-25 NOTE — Telephone Encounter (Signed)
Patient notified and rx sent in.  Rx okayed per Dr. Etter Sjogren

## 2017-07-23 ENCOUNTER — Other Ambulatory Visit (INDEPENDENT_AMBULATORY_CARE_PROVIDER_SITE_OTHER): Payer: PRIVATE HEALTH INSURANCE

## 2017-07-23 DIAGNOSIS — E785 Hyperlipidemia, unspecified: Secondary | ICD-10-CM | POA: Diagnosis not present

## 2017-07-23 LAB — COMPREHENSIVE METABOLIC PANEL
ALK PHOS: 47 U/L (ref 39–117)
ALT: 38 U/L — ABNORMAL HIGH (ref 0–35)
AST: 42 U/L — AB (ref 0–37)
Albumin: 4.1 g/dL (ref 3.5–5.2)
BILIRUBIN TOTAL: 0.5 mg/dL (ref 0.2–1.2)
BUN: 11 mg/dL (ref 6–23)
CALCIUM: 9.3 mg/dL (ref 8.4–10.5)
CO2: 26 mEq/L (ref 19–32)
CREATININE: 0.71 mg/dL (ref 0.40–1.20)
Chloride: 107 mEq/L (ref 96–112)
GFR: 89.96 mL/min (ref >60.00)
Glucose, Bld: 102 mg/dL — ABNORMAL HIGH (ref 70–99)
Potassium: 3.7 mEq/L (ref 3.5–5.1)
Sodium: 141 mEq/L (ref 135–145)
TOTAL PROTEIN: 7 g/dL (ref 6.0–8.3)

## 2017-07-23 LAB — LIPID PANEL
Cholesterol: 142 mg/dL (ref 0–200)
HDL: 35.7 mg/dL — AB (ref >39.00)
LDL CALC: 67 mg/dL (ref 0–99)
NonHDL: 106.15
Total CHOL/HDL Ratio: 4
Triglycerides: 196 mg/dL — ABNORMAL HIGH (ref 0.0–149.0)
VLDL: 39.2 mg/dL (ref 0.0–40.0)

## 2017-07-27 ENCOUNTER — Telehealth: Payer: Self-pay | Admitting: Family Medicine

## 2017-07-27 ENCOUNTER — Other Ambulatory Visit: Payer: Self-pay | Admitting: Family Medicine

## 2017-07-27 DIAGNOSIS — E785 Hyperlipidemia, unspecified: Secondary | ICD-10-CM

## 2017-07-27 NOTE — Telephone Encounter (Signed)
Pt returned call for lab results.

## 2017-08-14 ENCOUNTER — Telehealth: Payer: Self-pay | Admitting: Family Medicine

## 2017-08-14 NOTE — Telephone Encounter (Signed)
Mailed pt's lab results (07/23/17) to her home address per her request.

## 2017-09-30 ENCOUNTER — Telehealth: Payer: Self-pay | Admitting: *Deleted

## 2017-09-30 NOTE — Telephone Encounter (Signed)
Received request for Medical records from [place], forwarded to [place] for email/scan/SLS 12/05

## 2017-11-04 ENCOUNTER — Telehealth: Payer: Self-pay | Admitting: *Deleted

## 2017-11-04 NOTE — Telephone Encounter (Signed)
Received request for Medical records from [place], forwarded to [place] for email/scan/SLS 01/09

## 2017-11-17 ENCOUNTER — Other Ambulatory Visit: Payer: Self-pay | Admitting: Family Medicine

## 2017-11-18 ENCOUNTER — Telehealth: Payer: Self-pay | Admitting: Family Medicine

## 2017-11-18 DIAGNOSIS — E785 Hyperlipidemia, unspecified: Secondary | ICD-10-CM

## 2017-11-18 MED ORDER — FENOFIBRATE 160 MG PO TABS: 160.0000 mg | ORAL_TABLET | Freq: Every day | ORAL | 4 refills | Status: DC

## 2017-11-18 NOTE — Telephone Encounter (Signed)
Copied from [place] [phone]. Topic: Quick Communication - See Telephone Encounter >> Nov 18, 2017 11:05 AM Conception Chancy, NT wrote: CRM for notification. See Telephone encounter for:  11/18/17.  Pt was denied when she requesting a refill on Fenofibrate and would like to know why. Please advise and contact patient.   [place].

## 2017-11-18 NOTE — Telephone Encounter (Signed)
Called Pt and she stated she wanted Rx sent into [facility] on [address]. Had not been refilled yet but I sent it over as requested. Pt states that she wanted a 30 day supply as opposed to 90 also. Adjustments made, called disconnected

## 2017-11-18 NOTE — Telephone Encounter (Signed)
Copied from [place] [phone]. Topic: Quick Communication - See Telephone Encounter >> Nov 18, 2017 11:05 AM Conception Chancy, NT wrote: CRM for notification. See Telephone encounter for:  11/18/17. >> Nov 18, 2017  4:39 PM Cleaster Corin, NT wrote: Pt. Calling back about refill hasn't heard anything as of yet.

## 2017-12-29 ENCOUNTER — Other Ambulatory Visit: Payer: PRIVATE HEALTH INSURANCE

## 2018-01-01 ENCOUNTER — Other Ambulatory Visit (INDEPENDENT_AMBULATORY_CARE_PROVIDER_SITE_OTHER): Payer: PRIVATE HEALTH INSURANCE

## 2018-01-01 ENCOUNTER — Telehealth: Payer: Self-pay | Admitting: Family Medicine

## 2018-01-01 DIAGNOSIS — E785 Hyperlipidemia, unspecified: Secondary | ICD-10-CM

## 2018-01-01 LAB — COMPREHENSIVE METABOLIC PANEL
ALBUMIN: 4.3 g/dL (ref 3.5–5.2)
ALK PHOS: 46 U/L (ref 39–117)
ALT: 38 U/L — ABNORMAL HIGH (ref 0–35)
AST: 39 U/L — ABNORMAL HIGH (ref 0–37)
BUN: 16 mg/dL (ref 6–23)
CALCIUM: 9.9 mg/dL (ref 8.4–10.5)
CO2: 25 mEq/L (ref 19–32)
Chloride: 103 mEq/L (ref 96–112)
Creatinine, Ser: 0.71 mg/dL (ref 0.40–1.20)
GFR: 89.82 mL/min (ref >60.00)
Glucose, Bld: 97 mg/dL (ref 70–99)
POTASSIUM: 4 meq/L (ref 3.5–5.1)
SODIUM: 137 meq/L (ref 135–145)
TOTAL PROTEIN: 7.4 g/dL (ref 6.0–8.3)
Total Bilirubin: 0.4 mg/dL (ref 0.2–1.2)

## 2018-01-01 NOTE — Telephone Encounter (Signed)
Copied from [place] [phone]. Topic: Quick Communication - See Telephone Encounter >> Jan 01, 2018  9:59 AM Rosalin Hawking wrote: CRM for notification. See Telephone encounter for:  01/01/18.   Pt came in office today to have lab work done (01-01-2018), pt requested to please mail pt's result that was done today to home address. Pt stated that she prefer to have a print out of her lab results. Please advise.

## 2018-01-05 ENCOUNTER — Encounter: Payer: Self-pay | Admitting: *Deleted

## 2018-01-05 NOTE — Telephone Encounter (Signed)
Letter sent.

## 2018-02-23 ENCOUNTER — Telehealth: Payer: Self-pay | Admitting: *Deleted

## 2018-02-23 NOTE — Telephone Encounter (Signed)
Received inquiry on processing delay regarding a claim from [place], Claims Admin for [employer]; forwarded to [place] 04/30

## 2018-05-31 ENCOUNTER — Telehealth: Payer: Self-pay | Admitting: *Deleted

## 2018-05-31 NOTE — Telephone Encounter (Signed)
Received request for Medical records from [place], forwarded to [place] for email/scan/SLS 08/05

## 2018-06-18 ENCOUNTER — Telehealth: Payer: Self-pay | Admitting: Family Medicine

## 2018-06-18 MED ORDER — SERTRALINE HCL 100 MG PO TABS: ORAL_TABLET | ORAL | 0 refills | Status: DC

## 2018-06-18 NOTE — Telephone Encounter (Signed)
Copied from [place] [id number]. Topic: Quick Communication - Rx Refill/Question >> Jun 18, 2018  2:36 PM Reyne Dumas L wrote: Medication: sertraline (ZOLOFT) 100 MG tablet  Has the patient contacted their pharmacy? No - new pharmacy (Agent: If no, request that the patient contact the pharmacy for the refill.) (Agent: If yes, when and what did the pharmacy advise?)  Preferred Pharmacy (with phone number or street name): [place], [place] [phone] (Phone) [phone] (Fax)  Agent: Please be advised that RX refills may take up to 3 business days. We ask that you follow-up with your pharmacy.

## 2018-06-18 NOTE — Telephone Encounter (Signed)
Zoloft refill; LRF 11/18/17; # 135; RF x 1 Last Office visit 01/19/17 PCP Dr. Carollee Herter [facility]: [facility] on [address] [place]   Refilled x 1 mo per protocol; needs office visit

## 2018-07-07 ENCOUNTER — Telehealth: Payer: Self-pay

## 2018-07-07 DIAGNOSIS — E785 Hyperlipidemia, unspecified: Secondary | ICD-10-CM

## 2018-07-07 DIAGNOSIS — R748 Abnormal levels of other serum enzymes: Secondary | ICD-10-CM

## 2018-07-07 NOTE — Telephone Encounter (Signed)
Copied from [place] [phone]. Topic: General - Other >> Jul 07, 2018  3:44 PM Marin Olp L wrote: Reason for CRM: Requesting fasting lab orders for cholesterol check every 6 months. Last completed 01/01/2018.

## 2018-07-08 NOTE — Addendum Note (Signed)
Addended by: Kem Boroughs D on: 07/08/2018 02:30 PM   Modules accepted: Orders

## 2018-07-08 NOTE — Telephone Encounter (Signed)
Left message on machine orders placed and to call back to make lab only appt.

## 2018-07-29 ENCOUNTER — Other Ambulatory Visit: Payer: Self-pay | Admitting: Family Medicine

## 2018-08-03 ENCOUNTER — Other Ambulatory Visit (INDEPENDENT_AMBULATORY_CARE_PROVIDER_SITE_OTHER): Payer: PRIVATE HEALTH INSURANCE

## 2018-08-03 ENCOUNTER — Other Ambulatory Visit: Payer: Self-pay | Admitting: Family Medicine

## 2018-08-03 DIAGNOSIS — E785 Hyperlipidemia, unspecified: Secondary | ICD-10-CM

## 2018-08-03 DIAGNOSIS — R748 Abnormal levels of other serum enzymes: Secondary | ICD-10-CM

## 2018-08-03 LAB — LDL CHOLESTEROL, DIRECT: Direct LDL: 166 mg/dL

## 2018-08-03 LAB — COMPREHENSIVE METABOLIC PANEL
ALBUMIN: 4.3 g/dL (ref 3.5–5.2)
ALK PHOS: 84 U/L (ref 39–117)
ALT: 39 U/L — AB (ref 0–35)
AST: 24 U/L (ref 0–37)
BILIRUBIN TOTAL: 0.3 mg/dL (ref 0.2–1.2)
BUN: 12 mg/dL (ref 6–23)
CALCIUM: 9.6 mg/dL (ref 8.4–10.5)
CO2: 23 mEq/L (ref 19–32)
CREATININE: 0.58 mg/dL (ref 0.40–1.20)
Chloride: 105 mEq/L (ref 96–112)
GFR: 113.2 mL/min (ref >60.00)
Glucose, Bld: 134 mg/dL — ABNORMAL HIGH (ref 70–99)
Potassium: 4 mEq/L (ref 3.5–5.1)
SODIUM: 138 meq/L (ref 135–145)
Total Protein: 7.3 g/dL (ref 6.0–8.3)

## 2018-08-03 LAB — LIPID PANEL
CHOL/HDL RATIO: 6
CHOLESTEROL: 232 mg/dL — AB (ref 0–200)
HDL: 36.7 mg/dL — ABNORMAL LOW (ref >39.00)
NonHDL: 195.4
TRIGLYCERIDES: 252 mg/dL — AB (ref 0.0–149.0)
VLDL: 50.4 mg/dL — ABNORMAL HIGH (ref 0.0–40.0)

## 2018-08-03 NOTE — Telephone Encounter (Signed)
Copied from [place] [phone]. Topic: General - Other >> Aug 03, 2018 12:53 PM Lennox Solders wrote: Reason for CRM:pt had blood work today and needs  refills on lovastatin and fenofibrate . [facility] [place] on [address]

## 2018-08-05 MED ORDER — ROSUVASTATIN CALCIUM 20 MG PO TABS: 20.0000 mg | ORAL_TABLET | Freq: Every day | ORAL | 3 refills | Status: DC

## 2018-08-05 MED ORDER — FENOFIBRATE 160 MG PO TABS: 160.0000 mg | ORAL_TABLET | Freq: Every day | ORAL | 5 refills | Status: DC

## 2018-08-05 NOTE — Telephone Encounter (Signed)
Notes recorded by Carollee Herter, Alferd Apa, DO on 08/04/2018 at 12:08 PM EDT Cholesterol--- LDL goal < 100, HDL >40, TG < 150. Diet and exercise will increase HDL and decrease LDL and TG. Fish, Fish Oil, Flaxseed oil will also help increase the HDL and decrease Triglycerides.  Recheck labs in 3 months Change lovastatin to crestor 20 mg #30 1 po qhs, 2 refills  Glucose elevated-- watch simple sugars and starches  Lipid, cmp, hgba1c .

## 2018-08-05 NOTE — Telephone Encounter (Signed)
PCP changing to Crestor 20mg  1 tab qhs--rx sent. Refills for fenofibrate 160mg  sent. Labs ordered.

## 2018-08-11 ENCOUNTER — Telehealth: Payer: Self-pay | Admitting: Family Medicine

## 2018-08-11 NOTE — Telephone Encounter (Signed)
Author phoned pt. re: cholesterol labs. Pt. stated she had not been taking her lovastatin or fenofibrate for about two weeks prior to bloodwork as she had run out of her meds. Pt. has not started taking crestor. Pt. states she is soon going to run out of zoloft, and made appointment with [facility] per provider request for 10/21. Pt. Denies any other concerns or needs at this time.

## 2018-08-11 NOTE — Telephone Encounter (Signed)
Copied from [place] [id number]. Topic: General - Other >> Aug 11, 2018 12:33 PM Vernona Rieger wrote: Reason for CRM: Patient would like a nurse to call her back in regards to her labs she had on 10/8, patient states she never received a call about them.  She also wants a copy mailed to her.

## 2018-08-11 NOTE — Telephone Encounter (Signed)
Restart lovastatin and fenofibrate and recheck in 3 months

## 2018-08-12 NOTE — Telephone Encounter (Signed)
Author phoned pt. to relay Dr. Nonda Lou message, no answer. Author left generic VM asking for return call. OK for P[facility] to relay to pt. the recommendation to start fenofibrate with the lovastatin, and recheck lipid profile in 3 months (need to make lab appointment and order lipid profile if not already ordered).

## 2018-08-16 ENCOUNTER — Encounter: Payer: Self-pay | Admitting: Family Medicine

## 2018-08-16 ENCOUNTER — Ambulatory Visit (INDEPENDENT_AMBULATORY_CARE_PROVIDER_SITE_OTHER): Payer: PRIVATE HEALTH INSURANCE | Admitting: Family Medicine

## 2018-08-16 ENCOUNTER — Ambulatory Visit: Payer: PRIVATE HEALTH INSURANCE | Admitting: Family Medicine

## 2018-08-16 VITALS — BP 110/70 | HR 78 | Temp 98.0°F | Resp 16 | Ht 62.0 in | Wt 240.2 lb

## 2018-08-16 DIAGNOSIS — F325 Major depressive disorder, single episode, in full remission: Secondary | ICD-10-CM

## 2018-08-16 DIAGNOSIS — R739 Hyperglycemia, unspecified: Secondary | ICD-10-CM | POA: Diagnosis not present

## 2018-08-16 DIAGNOSIS — F320 Major depressive disorder, single episode, mild: Secondary | ICD-10-CM | POA: Diagnosis not present

## 2018-08-16 DIAGNOSIS — E785 Hyperlipidemia, unspecified: Secondary | ICD-10-CM

## 2018-08-16 MED ORDER — SERTRALINE HCL 100 MG PO TABS: ORAL_TABLET | ORAL | 1 refills | Status: DC

## 2018-08-16 MED ORDER — FENOFIBRATE 160 MG PO TABS: 160.0000 mg | ORAL_TABLET | Freq: Every day | ORAL | 1 refills | Status: DC

## 2018-08-16 MED ORDER — LOVASTATIN 20 MG PO TABS: 20.0000 mg | ORAL_TABLET | Freq: Every day | ORAL | 3 refills | Status: DC

## 2018-08-16 NOTE — Assessment & Plan Note (Addendum)
Tolerating statin, encouraged heart healthy diet, avoid trans fats, minimize simple carbs and saturated fats. Increase exercise as tolerated Lab Results  Component Value Date   CHOL 232 (H) 08/03/2018   HDL 36.70 (L) 08/03/2018   LDLCALC 67 07/23/2017   LDLDIRECT 166.0 08/03/2018   TRIG 252.0 (H) 08/03/2018   CHOLHDL 6 08/03/2018  pt was not taking meds when done  Recheck 2 months

## 2018-08-16 NOTE — Patient Instructions (Signed)

## 2018-08-16 NOTE — Assessment & Plan Note (Signed)
con't zoloft  stable 

## 2018-08-16 NOTE — Progress Notes (Signed)
Patient ID: Sydney Thompson, female    DOB: 1961/01/07  Age: 57 y.o. MRN: [id number]    Subjective:  Subjective  HPI Sydney Thompson presents for f/u chol , depression and to go over labs.  She was not taking the lovastatin or fenofibrate when labs were done.  No complaints.  Review of Systems  Constitutional: Negative for appetite change, diaphoresis, fatigue and unexpected weight change.  Eyes: Negative for pain, redness and visual disturbance.  Respiratory: Negative for cough, chest tightness, shortness of breath and wheezing.   Cardiovascular: Negative for chest pain, palpitations and leg swelling.  Endocrine: Negative for cold intolerance, heat intolerance, polydipsia, polyphagia and polyuria.  Genitourinary: Negative for difficulty urinating, dysuria and frequency.  Neurological: Negative for dizziness, light-headedness, numbness and headaches.    History Past Medical History:  Diagnosis Date  . Depression   . GERD (gastroesophageal reflux disease)   . IBS (irritable bowel syndrome)     She has a past surgical history that includes Cholecystectomy; Lithotripsy; and Gallbladder surgery.   Her family history includes Alzheimer's disease in her mother; Cancer in her mother; Diabetes in her father, paternal uncle, paternal uncle, and paternal uncle; Heart disease in her father; Hyperlipidemia in her father; Stroke in her father.She reports that she has never smoked. She has never used smokeless tobacco. She reports that she does not drink alcohol or use drugs.  Current Outpatient Medications on File Prior to Visit  Medication Sig Dispense Refill  . nystatin-triamcinolone (MYCOLOG II) cream Apply 1 application topically 2 (two) times daily. 60 g 1  . rosuvastatin (CRESTOR) 20 MG tablet Take 1 tablet (20 mg total) by mouth at bedtime. 30 tablet 3   No current facility-administered medications on file prior to visit.      Objective:  Objective  Physical Exam  Constitutional:  She is oriented to person, place, and time. She appears well-developed and well-nourished.  HENT:  Head: Normocephalic and atraumatic.  Eyes: Conjunctivae and EOM are normal.  Neck: Normal range of motion. Neck supple. No JVD present. Carotid bruit is not present. No thyromegaly present.  Cardiovascular: Normal rate, regular rhythm and normal heart sounds.  No murmur heard. Pulmonary/Chest: Effort normal and breath sounds normal. No respiratory distress. She has no wheezes. She has no rales. She exhibits no tenderness.  Musculoskeletal: She exhibits no edema.  Neurological: She is alert and oriented to person, place, and time.  Psychiatric: She has a normal mood and affect. Her behavior is normal. Judgment and thought content normal.  Nursing note and vitals reviewed.  BP 110/70 (BP Location: Right Arm, Cuff Size: Normal)   Pulse 78   Temp 98 F (36.7 C) (Oral)   Resp 16   Ht 5\' 2"  (1.575 m)   Wt 240 lb 3.2 oz (109 kg)   SpO2 96%   BMI 43.93 kg/m  Wt Readings from Last 3 Encounters:  08/16/18 240 lb 3.2 oz (109 kg)  01/19/17 244 lb 6.4 oz (110.9 kg)  10/23/15 236 lb 3.2 oz (107.1 kg)     Lab Results  Component Value Date   WBC 7.8 01/19/2017   HGB 13.1 01/19/2017   HCT 39.5 01/19/2017   PLT 259.0 01/19/2017   GLUCOSE 134 (H) 08/03/2018   CHOL 232 (H) 08/03/2018   TRIG 252.0 (H) 08/03/2018   HDL 36.70 (L) 08/03/2018   LDLDIRECT 166.0 08/03/2018   LDLCALC 67 07/23/2017   ALT 39 (H) 08/03/2018   AST 24 08/03/2018   NA 138 08/03/2018  K 4.0 08/03/2018   CL 105 08/03/2018   CREATININE 0.58 08/03/2018   BUN 12 08/03/2018   CO2 23 08/03/2018   TSH 4.23 01/19/2017   INR 1.0 07/17/2009   HGBA1C 5.7 02/20/2010    Dg Hand 2 View Right  Result Date: 01/19/2017 CLINICAL DATA:  Right-sided hand pain, initial encounter EXAM: RIGHT HAND - 2 VIEW COMPARISON:  None. FINDINGS: There is no evidence of fracture or dislocation. There is no evidence of arthropathy or other focal  bone abnormality. Soft tissues are unremarkable. IMPRESSION: No acute abnormality noted. Electronically Signed   By: Inez Catalina M.D.   On: 01/19/2017 12:32   Dg Hand 2 View Left  Result Date: 01/19/2017 CLINICAL DATA:  Bilateral hand pain for 1 year. No history of injury. EXAM: LEFT HAND - 2 VIEW COMPARISON:  No priors. FINDINGS: Small periarticular erosion in the radial aspect of the base of the second proximal phalanx adjacent to the MCP joint. No periarticular lucency. No loss of joint space. No other erosions or significant degenerative changes are noted. IMPRESSION: 1. Small periarticular erosion along the radial aspect of the base of the second proximal phalanx. This is an isolated finding, but could be indicative of an inflammatory arthropathy. Further clinical correlation is recommended. Electronically Signed   By: Vinnie Langton M.D.   On: 01/19/2017 12:49     Assessment & Plan:  Plan  I have changed Sydney Thompson's sertraline. I am also having her start on lovastatin. Additionally, I am having her maintain her nystatin-triamcinolone, rosuvastatin, and fenofibrate.  Meds ordered this encounter  Medications  . sertraline (ZOLOFT) 100 MG tablet    Sig: TAKE 1 & 1/2 (ONE & ONE-HALF) TABLETS BY MOUTH ONCE DAILY.    Dispense:  135 tablet    Refill:  1    Please consider 90 day supplies to promote better adherence  . fenofibrate 160 MG tablet    Sig: Take 1 tablet (160 mg total) by mouth daily.    Dispense:  90 tablet    Refill:  1  . lovastatin (MEVACOR) 20 MG tablet    Sig: Take 1 tablet (20 mg total) by mouth at bedtime.    Dispense:  90 tablet    Refill:  3    Problem List Items Addressed This Visit      Unprioritized   Depression, major, single episode, complete remission ([place])    con't zoloft stable      Relevant Medications   sertraline (ZOLOFT) 100 MG tablet   Hyperlipidemia LDL goal <100    Tolerating statin, encouraged heart healthy diet, avoid trans fats,  minimize simple carbs and saturated fats. Increase exercise as tolerated Lab Results  Component Value Date   CHOL 232 (H) 08/03/2018   HDL 36.70 (L) 08/03/2018   LDLCALC 67 07/23/2017   LDLDIRECT 166.0 08/03/2018   TRIG 252.0 (H) 08/03/2018   CHOLHDL 6 08/03/2018  pt was not taking meds when done  Recheck 2 months      Relevant Medications   fenofibrate 160 MG tablet   lovastatin (MEVACOR) 20 MG tablet    Other Visit Diagnoses    Hyperlipidemia    -  Primary   Relevant Medications   fenofibrate 160 MG tablet   lovastatin (MEVACOR) 20 MG tablet   Depression, major, single episode, mild ([facility])       Relevant Medications   sertraline (ZOLOFT) 100 MG tablet   Hyperglycemia       Relevant Orders  Hemoglobin O7S   Basic metabolic panel   Hyperlipidemia, unspecified hyperlipidemia type       Relevant Medications   fenofibrate 160 MG tablet   lovastatin (MEVACOR) 20 MG tablet      Follow-up: Return in about 2 months (around 10/16/2018), or if symptoms worsen or fail to improve, for labs .  Ann Held, DO

## 2018-08-17 LAB — BASIC METABOLIC PANEL
BUN: 18 mg/dL (ref 6–23)
CHLORIDE: 106 meq/L (ref 96–112)
CO2: 21 meq/L (ref 19–32)
CREATININE: 0.71 mg/dL (ref 0.40–1.20)
Calcium: 9.8 mg/dL (ref 8.4–10.5)
GFR: 89.62 mL/min (ref >60.00)
GLUCOSE: 157 mg/dL — AB (ref 70–99)
Potassium: 4.1 mEq/L (ref 3.5–5.1)
SODIUM: 138 meq/L (ref 135–145)

## 2018-08-17 LAB — HEMOGLOBIN A1C: Hgb A1c MFr Bld: 5.9 % (ref 4.6–6.5)

## 2018-08-18 NOTE — Addendum Note (Signed)
Addended byDamita Dunnings D on: 08/18/2018 12:02 PM   Modules accepted: Orders

## 2018-08-19 ENCOUNTER — Telehealth: Payer: Self-pay | Admitting: Family Medicine

## 2018-08-19 NOTE — Telephone Encounter (Signed)
Copied from [place] [phone]. Topic: Quick Communication - See Telephone Encounter >> Aug 19, 2018  4:38 PM Vernona Rieger wrote: CRM for notification. See Telephone encounter for: 08/19/18.\  Patient states she had lab work done Monday, October 21st, please contact patient. She would like the results.

## 2018-08-20 ENCOUNTER — Encounter: Payer: Self-pay | Admitting: *Deleted

## 2018-08-20 NOTE — Telephone Encounter (Signed)
Left message on machine that labs looks great and we will recheck in 6 months.  Also sent patient a copy of labs.

## 2018-11-18 DIAGNOSIS — M545 Low back pain, unspecified: Secondary | ICD-10-CM | POA: Insufficient documentation

## 2018-11-18 DIAGNOSIS — M47812 Spondylosis without myelopathy or radiculopathy, cervical region: Secondary | ICD-10-CM | POA: Insufficient documentation

## 2018-11-18 DIAGNOSIS — G8929 Other chronic pain: Secondary | ICD-10-CM | POA: Insufficient documentation

## 2019-02-23 ENCOUNTER — Other Ambulatory Visit: Payer: Self-pay | Admitting: Family Medicine

## 2019-02-23 DIAGNOSIS — F320 Major depressive disorder, single episode, mild: Secondary | ICD-10-CM

## 2019-02-28 ENCOUNTER — Ambulatory Visit (INDEPENDENT_AMBULATORY_CARE_PROVIDER_SITE_OTHER): Payer: Self-pay | Admitting: Family Medicine

## 2019-02-28 ENCOUNTER — Encounter: Payer: Self-pay | Admitting: Family Medicine

## 2019-02-28 ENCOUNTER — Other Ambulatory Visit: Payer: Self-pay

## 2019-02-28 DIAGNOSIS — F320 Major depressive disorder, single episode, mild: Secondary | ICD-10-CM

## 2019-02-28 MED ORDER — SERTRALINE HCL 100 MG PO TABS: ORAL_TABLET | ORAL | 5 refills | Status: DC

## 2019-02-28 NOTE — Progress Notes (Signed)
Virtual Visit via Video Note  I connected with Sydney Thompson on 02/28/19 at  3:30 PM EDT by a video enabled telemedicine application and verified that I am speaking with the correct person using two identifiers.  Location: Patient: home  Provider: home   I discussed the limitations of evaluation and management by telemedicine and the availability of in person appointments. The patient expressed understanding and agreed to proceed.  History of Present Illness: Pt is home with no complaints she just needs a refill of her zoloft --- it is working well for her.     Observations/Objective: No vitals due to web visit Assessment and Plan: 1. Depression, major, single episode, mild (HCC) Stable--- med refilled  - sertraline (ZOLOFT) 100 MG tablet; 1 1/2  Tab po qd  Dispense: 135 tablet; Refill: 5   Follow Up Instructions:    I discussed the assessment and treatment plan with the patient. The patient was provided an opportunity to ask questions and all were answered. The patient agreed with the plan and demonstrated an understanding of the instructions.   The patient was advised to call back or seek an in-person evaluation if the symptoms worsen or if the condition fails to improve as anticipated.  I provided 15 minutes of non-face-to-face time during this encounter.   Ann Held, DO

## 2019-03-01 ENCOUNTER — Ambulatory Visit: Payer: Self-pay | Admitting: Family Medicine

## 2019-03-01 ENCOUNTER — Other Ambulatory Visit: Payer: Self-pay

## 2019-03-02 ENCOUNTER — Telehealth: Payer: Self-pay | Admitting: Family Medicine

## 2019-03-02 NOTE — Telephone Encounter (Signed)
LVM for pt to schedule (Return in about 6 months (around 08/31/2019) pt had a vov and is needing a fu appt.

## 2019-07-15 LAB — HM MAMMOGRAPHY

## 2019-07-22 ENCOUNTER — Encounter: Payer: Self-pay | Admitting: Family Medicine

## 2019-08-04 ENCOUNTER — Telehealth: Payer: Self-pay

## 2019-08-04 NOTE — Telephone Encounter (Signed)
Great!  Have her call us if she needs anything.

## 2019-08-04 NOTE — Telephone Encounter (Signed)
Copied from [place] [phone]. Topic: General - Inquiry >> Aug 04, 2019 12:37 PM Mathis Bud wrote: Reason for CRM: Patient wanted PCP to know that she did test positive for covid.  It was a rapid test. Patient states she is doing well.   Call back [phone]

## 2019-08-05 NOTE — Telephone Encounter (Signed)
Left detailed message on machine to let us know if she needed anything.

## 2019-08-22 ENCOUNTER — Telehealth: Payer: Self-pay | Admitting: Family Medicine

## 2019-08-23 ENCOUNTER — Other Ambulatory Visit: Payer: Self-pay

## 2019-08-23 ENCOUNTER — Encounter: Payer: Self-pay | Admitting: Family Medicine

## 2019-08-23 ENCOUNTER — Ambulatory Visit (INDEPENDENT_AMBULATORY_CARE_PROVIDER_SITE_OTHER): Payer: Self-pay | Admitting: Family Medicine

## 2019-08-23 VITALS — Ht 62.0 in | Wt 240.0 lb

## 2019-08-23 DIAGNOSIS — J40 Bronchitis, not specified as acute or chronic: Secondary | ICD-10-CM

## 2019-08-23 MED ORDER — AZITHROMYCIN 250 MG PO TABS: ORAL_TABLET | ORAL | 0 refills | Status: DC

## 2019-08-23 MED ORDER — PROMETHAZINE-DM 6.25-15 MG/5ML PO SYRP: 5.0000 mL | ORAL_SOLUTION | Freq: Four times a day (QID) | ORAL | 0 refills | Status: DC | PRN

## 2019-08-23 NOTE — Progress Notes (Signed)
Virtual Visit via Video Note  I connected with Sydney Thompson on 08/23/19 at  1:40 PM EDT by a video enabled telemedicine application and verified that I am speaking with the correct person using two identifiers. Had to convert to phone call due to video not connecting Location: Patient: home  Provider: office    I discussed the limitations of evaluation and management by telemedicine and the availability of in person appointments. The patient expressed understanding and agreed to proceed.  History of Present Illness: Pt is home c/o cough since having covid  + production cough and headaches  + otc cough meds and cold and flu meds with no relie   Observations/Objective: No vitals obtained Pt is in nad  Assessment and Plan: 1. Bronchitis abx and cough meds Call if symptoms worsen or do not improve - azithromycin (ZITHROMAX Z-PAK) 250 MG tablet; As directed  Dispense: 6 each; Refill: 0 - promethazine-dextromethorphan (PROMETHAZINE-DM) 6.25-15 MG/5ML syrup; Take 5 mLs by mouth 4 (four) times daily as needed.  Dispense: 118 mL; Refill: 0   Follow Up Instructions:    I discussed the assessment and treatment plan with the patient. The patient was provided an opportunity to ask questions and all were answered. The patient agreed with the plan and demonstrated an understanding of the instructions.   The patient was advised to call back or seek an in-person evaluation if the symptoms worsen or if the condition fails to improve as anticipated.  I provided 15 minutes of non-face-to-face time during this encounter.   Ann Held, DO

## 2019-09-19 ENCOUNTER — Other Ambulatory Visit: Payer: Self-pay | Admitting: Family Medicine

## 2019-09-19 DIAGNOSIS — E785 Hyperlipidemia, unspecified: Secondary | ICD-10-CM

## 2019-10-26 ENCOUNTER — Ambulatory Visit: Payer: Self-pay | Admitting: *Deleted

## 2019-10-26 NOTE — Telephone Encounter (Signed)
Ok to have a occassional drink

## 2019-10-26 NOTE — Telephone Encounter (Signed)
Summary: med question   Patient would like to know if she can safely drink alcohol on fenofibrate 160 MG tablet.      Per [employer]- there is nothing mentioned in interactions, patient education or precautions about alcohol and fenofibrate. Patient states she is not a regular drinker- but likes to have a glass once a month or so. Advised will send question to PCP for review.  Reason for Disposition . [1] Caller has NON-URGENT medication question about med that PCP prescribed AND [2] triager unable to answer question  Protocols used: MEDICATION QUESTION CALL-A-AH

## 2019-12-14 DIAGNOSIS — L309 Dermatitis, unspecified: Secondary | ICD-10-CM | POA: Diagnosis not present

## 2019-12-14 DIAGNOSIS — D485 Neoplasm of uncertain behavior of skin: Secondary | ICD-10-CM | POA: Diagnosis not present

## 2019-12-14 DIAGNOSIS — L409 Psoriasis, unspecified: Secondary | ICD-10-CM | POA: Diagnosis not present

## 2019-12-19 ENCOUNTER — Telehealth: Payer: Self-pay | Admitting: Family Medicine

## 2019-12-19 NOTE — Telephone Encounter (Signed)
Dr Jodelle Gross. has retired and patient is requesting a prescription  Refill for Gabapentin 300mg , requested patient make an appointment with Dr Etter Sjogren.

## 2019-12-19 NOTE — Telephone Encounter (Signed)
Gabapentin not patients med list. She will need a visit or virtual.

## 2019-12-20 NOTE — Telephone Encounter (Signed)
Called pt left message to call back for appt.

## 2019-12-28 DIAGNOSIS — L400 Psoriasis vulgaris: Secondary | ICD-10-CM | POA: Diagnosis not present

## 2019-12-28 DIAGNOSIS — E785 Hyperlipidemia, unspecified: Secondary | ICD-10-CM | POA: Diagnosis not present

## 2019-12-28 DIAGNOSIS — Z79899 Other long term (current) drug therapy: Secondary | ICD-10-CM | POA: Diagnosis not present

## 2020-01-04 DIAGNOSIS — L400 Psoriasis vulgaris: Secondary | ICD-10-CM | POA: Diagnosis not present

## 2020-01-04 DIAGNOSIS — L4050 Arthropathic psoriasis, unspecified: Secondary | ICD-10-CM | POA: Diagnosis not present

## 2020-04-13 ENCOUNTER — Other Ambulatory Visit: Payer: Self-pay

## 2020-04-13 DIAGNOSIS — F320 Major depressive disorder, single episode, mild: Secondary | ICD-10-CM

## 2020-04-13 MED ORDER — SERTRALINE HCL 100 MG PO TABS: 150.0000 mg | ORAL_TABLET | Freq: Every day | ORAL | 0 refills | Status: DC

## 2020-05-07 ENCOUNTER — Other Ambulatory Visit: Payer: Self-pay

## 2020-05-07 ENCOUNTER — Encounter: Payer: Self-pay | Admitting: Family Medicine

## 2020-05-07 ENCOUNTER — Ambulatory Visit (INDEPENDENT_AMBULATORY_CARE_PROVIDER_SITE_OTHER): Payer: Medicare Other | Admitting: Family Medicine

## 2020-05-07 VITALS — BP 126/60 | HR 67 | Temp 97.8°F | Resp 18 | Ht 62.0 in | Wt 234.8 lb

## 2020-05-07 DIAGNOSIS — E785 Hyperlipidemia, unspecified: Secondary | ICD-10-CM | POA: Diagnosis not present

## 2020-05-07 DIAGNOSIS — E1169 Type 2 diabetes mellitus with other specified complication: Secondary | ICD-10-CM

## 2020-05-07 DIAGNOSIS — M79641 Pain in right hand: Secondary | ICD-10-CM

## 2020-05-07 DIAGNOSIS — L299 Pruritus, unspecified: Secondary | ICD-10-CM | POA: Diagnosis not present

## 2020-05-07 DIAGNOSIS — R42 Dizziness and giddiness: Secondary | ICD-10-CM | POA: Diagnosis not present

## 2020-05-07 DIAGNOSIS — M79642 Pain in left hand: Secondary | ICD-10-CM

## 2020-05-07 MED ORDER — HYDROCORTISONE-ACETIC ACID 1-2 % OT SOLN: 3.0000 [drp] | Freq: Two times a day (BID) | OTIC | 0 refills | Status: DC

## 2020-05-07 MED ORDER — GABAPENTIN 300 MG PO CAPS: 300.0000 mg | ORAL_CAPSULE | Freq: Every day | ORAL | 3 refills | Status: DC

## 2020-05-07 NOTE — Assessment & Plan Note (Signed)
gtts per orders

## 2020-05-07 NOTE — Assessment & Plan Note (Signed)
Gabapentin refilled

## 2020-05-07 NOTE — Patient Instructions (Signed)

## 2020-05-07 NOTE — Assessment & Plan Note (Signed)
Encouraged heart healthy diet, increase exercise, avoid trans fats, consider a krill oil cap daily 

## 2020-05-07 NOTE — Progress Notes (Signed)
Patient ID: Sydney Thompson, female    DOB: May 02, 1961  Age: 59 y.o. MRN: [id number]    Subjective:  Subjective  HPI Sydney Thompson presents for multiple problems.  She c/o itchy ears and dizziness that comes and goes   No cp, no palpitations , no sob She also was given gabapentin for hand pain by hand surgeon but he retired and she needs a refill She also needs her reg labs done and refills   No other complaints   Review of Systems  Constitutional: Negative for appetite change, diaphoresis, fatigue and unexpected weight change.  Eyes: Negative for pain, redness and visual disturbance.  Respiratory: Negative for cough, chest tightness, shortness of breath and wheezing.   Cardiovascular: Negative for chest pain, palpitations and leg swelling.  Endocrine: Negative for cold intolerance, heat intolerance, polydipsia, polyphagia and polyuria.  Genitourinary: Negative for difficulty urinating, dysuria and frequency.  Neurological: Positive for dizziness and numbness. Negative for light-headedness and headaches.    History Past Medical History:  Diagnosis Date  . Depression   . GERD (gastroesophageal reflux disease)   . IBS (irritable bowel syndrome)     She has a past surgical history that includes Cholecystectomy; Lithotripsy; and Gallbladder surgery.   Her family history includes Alzheimer's disease in her mother; Cancer in her mother; Diabetes in her father, paternal uncle, paternal uncle, and paternal uncle; Heart disease in her father; Hyperlipidemia in her father; Stroke in her father.She reports that she has never smoked. She has never used smokeless tobacco. She reports that she does not drink alcohol and does not use drugs.  Current Outpatient Medications on File Prior to Visit  Medication Sig Dispense Refill  . fenofibrate 160 MG tablet Take 1 tablet by mouth once daily 90 tablet 1  . lovastatin (MEVACOR) 20 MG tablet TAKE 1 TABLET BY MOUTH AT BEDTIME 90 tablet 1  . Multiple  Vitamin (MULTI-VITAMIN DAILY PO) Take by mouth.    . nystatin-triamcinolone (MYCOLOG II) cream Apply 1 application topically 2 (two) times daily. 60 g 1  . Omega-3 Fatty Acids (FISH OIL) 1000 MG CAPS Take by mouth.    . sertraline (ZOLOFT) 100 MG tablet Take 1.5 tablets (150 mg total) by mouth daily. 135 tablet 0   No current facility-administered medications on file prior to visit.     Objective:  Objective  Physical Exam Vitals and nursing note reviewed.  Constitutional:      Appearance: She is well-developed.  HENT:     Head: Normocephalic and atraumatic.     Right Ear: Tympanic membrane normal. There is no impacted cerumen.     Left Ear: Tympanic membrane normal. There is no impacted cerumen.     Nose: Nose normal.  Eyes:     Conjunctiva/sclera: Conjunctivae normal.  Neck:     Thyroid: No thyromegaly.     Vascular: No carotid bruit or JVD.  Cardiovascular:     Rate and Rhythm: Normal rate and regular rhythm.     Heart sounds: Normal heart sounds. No murmur heard.   Pulmonary:     Effort: Pulmonary effort is normal. No respiratory distress.     Breath sounds: Normal breath sounds. No wheezing or rales.  Chest:     Chest wall: No tenderness.  Musculoskeletal:     Cervical back: Normal range of motion and neck supple.  Neurological:     Mental Status: She is alert and oriented to person, place, and time.    BP 126/60 (BP Location: Right Arm,  Patient Position: Sitting, Cuff Size: Large)   Pulse 67   Temp 97.8 F (36.6 C) (Temporal)   Resp 18   Ht 5\' 2"  (1.575 m)   Wt 234 lb 12.8 oz (106.5 kg)   SpO2 95%   BMI 42.95 kg/m  Wt Readings from Last 3 Encounters:  05/07/20 234 lb 12.8 oz (106.5 kg)  08/23/19 240 lb (108.9 kg)  08/16/18 240 lb 3.2 oz (109 kg)     Lab Results  Component Value Date   WBC 7.8 01/19/2017   HGB 13.1 01/19/2017   HCT 39.5 01/19/2017   PLT 259.0 01/19/2017   GLUCOSE 157 (H) 08/16/2018   CHOL 232 (H) 08/03/2018   TRIG 252.0 (H)  08/03/2018   HDL 36.70 (L) 08/03/2018   LDLDIRECT 166.0 08/03/2018   LDLCALC 67 07/23/2017   ALT 39 (H) 08/03/2018   AST 24 08/03/2018   NA 138 08/16/2018   K 4.1 08/16/2018   CL 106 08/16/2018   CREATININE 0.71 08/16/2018   BUN 18 08/16/2018   CO2 21 08/16/2018   TSH 4.23 01/19/2017   INR 1.0 07/17/2009   HGBA1C 5.9 08/16/2018    DG Hand 2 View Right  Result Date: 01/19/2017 CLINICAL DATA:  Right-sided hand pain, initial encounter EXAM: RIGHT HAND - 2 VIEW COMPARISON:  None. FINDINGS: There is no evidence of fracture or dislocation. There is no evidence of arthropathy or other focal bone abnormality. Soft tissues are unremarkable. IMPRESSION: No acute abnormality noted. Electronically Signed   By: Inez Catalina M.D.   On: 01/19/2017 12:32   DG Hand 2 View Left  Result Date: 01/19/2017 CLINICAL DATA:  Bilateral hand pain for 1 year. No history of injury. EXAM: LEFT HAND - 2 VIEW COMPARISON:  No priors. FINDINGS: Small periarticular erosion in the radial aspect of the base of the second proximal phalanx adjacent to the MCP joint. No periarticular lucency. No loss of joint space. No other erosions or significant degenerative changes are noted. IMPRESSION: 1. Small periarticular erosion along the radial aspect of the base of the second proximal phalanx. This is an isolated finding, but could be indicative of an inflammatory arthropathy. Further clinical correlation is recommended. Electronically Signed   By: Vinnie Langton M.D.   On: 01/19/2017 12:49     Assessment & Plan:  Plan  I have discontinued Elzabeth Natzke's rosuvastatin, azithromycin, and promethazine-dextromethorphan. I am also having her start on acetic acid-hydrocortisone and gabapentin. Additionally, I am having her maintain her nystatin-triamcinolone, fenofibrate, lovastatin, sertraline, Fish Oil, and Multiple Vitamin (MULTI-VITAMIN DAILY PO).  Meds ordered this encounter  Medications  . acetic acid-hydrocortisone  (VOSOL-HC) OTIC solution    Sig: Place 3 drops into both ears 2 (two) times daily.    Dispense:  10 mL    Refill:  0  . gabapentin (NEURONTIN) 300 MG capsule    Sig: Take 1 capsule (300 mg total) by mouth at bedtime.    Dispense:  90 capsule    Refill:  3    Problem List Items Addressed This Visit      Unprioritized   Bilateral hand pain    Gabapentin  refilled       Relevant Medications   gabapentin (NEURONTIN) 300 MG capsule   Dizzy   Relevant Orders   CBC with Differential/Platelet   Vitamin B12   Vitamin D (25 hydroxy)   TSH   Hyperlipidemia LDL goal <100    Encouraged heart healthy diet, increase exercise, avoid trans fats, consider  a krill oil cap daily      Itching of ear    gtts per orders        Relevant Medications   acetic acid-hydrocortisone (VOSOL-HC) OTIC solution    Other Visit Diagnoses    Hyperlipidemia associated with type 2 diabetes mellitus ([place])    -  Primary   Dyslipidemia       Relevant Orders   Lipid panel   Comprehensive metabolic panel      Follow-up: Return in about 3 months (around 08/07/2020), or if symptoms worsen or fail to improve, for fasting, annual exam.  Ann Held, DO

## 2020-05-08 LAB — VITAMIN B12: Vitamin B-12: 195 pg/mL — ABNORMAL LOW (ref 211–911)

## 2020-05-08 LAB — VITAMIN D 25 HYDROXY (VIT D DEFICIENCY, FRACTURES): VITD: 29.36 ng/mL — ABNORMAL LOW (ref 30.00–100.00)

## 2020-05-08 LAB — LIPID PANEL
Cholesterol: 140 mg/dL (ref 0–200)
HDL: 36 mg/dL — ABNORMAL LOW (ref >39.00)
LDL Cholesterol: 68 mg/dL (ref 0–99)
NonHDL: 104.37
Total CHOL/HDL Ratio: 4
Triglycerides: 184 mg/dL — ABNORMAL HIGH (ref 0.0–149.0)
VLDL: 36.8 mg/dL (ref 0.0–40.0)

## 2020-05-08 LAB — CBC WITH DIFFERENTIAL/PLATELET
Basophils Absolute: 0.1 10*3/uL (ref 0.0–0.1)
Basophils Relative: 0.9 % (ref 0.0–3.0)
Eosinophils Absolute: 0.1 10*3/uL (ref 0.0–0.7)
Eosinophils Relative: 1.7 % (ref 0.0–5.0)
HCT: 34.4 % — ABNORMAL LOW (ref 36.0–46.0)
Hemoglobin: 11.6 g/dL — ABNORMAL LOW (ref 12.0–15.0)
Lymphocytes Relative: 19.2 % (ref 12.0–46.0)
Lymphs Abs: 1.3 10*3/uL (ref 0.7–4.0)
MCHC: 33.7 g/dL (ref 30.0–36.0)
MCV: 97.1 fl (ref 78.0–100.0)
Monocytes Absolute: 0.5 10*3/uL (ref 0.1–1.0)
Monocytes Relative: 7.4 % (ref 3.0–12.0)
Neutro Abs: 4.7 10*3/uL (ref 1.4–7.7)
Neutrophils Relative %: 70.8 % (ref 43.0–77.0)
Platelets: 183 10*3/uL (ref 150.0–400.0)
RBC: 3.54 Mil/uL — ABNORMAL LOW (ref 3.87–5.11)
RDW: 13.8 % (ref 11.5–15.5)
WBC: 6.6 10*3/uL (ref 4.0–10.5)

## 2020-05-08 LAB — COMPREHENSIVE METABOLIC PANEL
ALT: 23 U/L (ref 0–35)
AST: 25 U/L (ref 0–37)
Albumin: 4.3 g/dL (ref 3.5–5.2)
Alkaline Phosphatase: 43 U/L (ref 39–117)
BUN: 20 mg/dL (ref 6–23)
CO2: 24 mEq/L (ref 19–32)
Calcium: 9.2 mg/dL (ref 8.4–10.5)
Chloride: 105 mEq/L (ref 96–112)
Creatinine, Ser: 0.81 mg/dL (ref 0.40–1.20)
GFR: 72 mL/min (ref >60.00)
Glucose, Bld: 96 mg/dL (ref 70–99)
Potassium: 4 mEq/L (ref 3.5–5.1)
Sodium: 138 mEq/L (ref 135–145)
Total Bilirubin: 0.4 mg/dL (ref 0.2–1.2)
Total Protein: 6.8 g/dL (ref 6.0–8.3)

## 2020-05-08 LAB — TSH: TSH: 3.23 u[IU]/mL (ref 0.35–4.50)

## 2020-05-11 ENCOUNTER — Other Ambulatory Visit: Payer: Self-pay

## 2020-05-11 MED ORDER — VITAMIN D (ERGOCALCIFEROL) 1.25 MG (50000 UNIT) PO CAPS
50000.0000 [IU] | ORAL_CAPSULE | ORAL | 2 refills | Status: DC
Start: 2020-05-11 — End: 2021-02-18

## 2020-05-29 ENCOUNTER — Telehealth: Payer: Self-pay | Admitting: Family Medicine

## 2020-05-29 DIAGNOSIS — Z1211 Encounter for screening for malignant neoplasm of colon: Secondary | ICD-10-CM

## 2020-05-29 NOTE — Telephone Encounter (Signed)
Patient requesting a colonoscopy from  At [facility].   Please Advise

## 2020-05-30 NOTE — Telephone Encounter (Signed)
Spoke with patient. Okay to place referral to have colonoscopy in [place]?

## 2020-05-30 NOTE — Telephone Encounter (Signed)
Pt called back to confirm referral location. VM left.

## 2020-05-31 NOTE — Telephone Encounter (Signed)
Referral placed.

## 2020-05-31 NOTE — Telephone Encounter (Signed)
As long as its for a screening colon and not because she is having a problem

## 2020-07-20 ENCOUNTER — Other Ambulatory Visit: Payer: Self-pay | Admitting: Family Medicine

## 2020-07-20 DIAGNOSIS — F320 Major depressive disorder, single episode, mild: Secondary | ICD-10-CM

## 2020-07-31 ENCOUNTER — Other Ambulatory Visit: Payer: Self-pay | Admitting: Family Medicine

## 2020-07-31 DIAGNOSIS — E785 Hyperlipidemia, unspecified: Secondary | ICD-10-CM

## 2020-08-07 NOTE — Telephone Encounter (Signed)
Patient is calling back to check the status of referral. Patient states she would like to go to [facility].

## 2020-08-08 NOTE — Telephone Encounter (Signed)
Patient contacted detailed message was left. Her referral was never specified where to go, so it was sent internal to [facility]. I have now Faxed it to [place]

## 2020-08-08 NOTE — Telephone Encounter (Signed)
Can you check on status of referral placed in August please?

## 2020-08-08 NOTE — Telephone Encounter (Signed)
Noted, thank you

## 2020-08-10 ENCOUNTER — Ambulatory Visit (INDEPENDENT_AMBULATORY_CARE_PROVIDER_SITE_OTHER): Payer: Medicare Other | Admitting: Family Medicine

## 2020-08-10 ENCOUNTER — Other Ambulatory Visit: Payer: Self-pay

## 2020-08-10 ENCOUNTER — Encounter: Payer: Self-pay | Admitting: Family Medicine

## 2020-08-10 VITALS — BP 108/70 | HR 71 | Temp 98.1°F | Resp 18 | Ht 62.0 in | Wt 233.8 lb

## 2020-08-10 DIAGNOSIS — Z1211 Encounter for screening for malignant neoplasm of colon: Secondary | ICD-10-CM

## 2020-08-10 DIAGNOSIS — I959 Hypotension, unspecified: Secondary | ICD-10-CM

## 2020-08-10 DIAGNOSIS — Z23 Encounter for immunization: Secondary | ICD-10-CM

## 2020-08-10 DIAGNOSIS — E785 Hyperlipidemia, unspecified: Secondary | ICD-10-CM | POA: Diagnosis not present

## 2020-08-10 DIAGNOSIS — I10 Essential (primary) hypertension: Secondary | ICD-10-CM | POA: Diagnosis not present

## 2020-08-10 DIAGNOSIS — Z0000 Encounter for general adult medical examination without abnormal findings: Secondary | ICD-10-CM | POA: Diagnosis not present

## 2020-08-10 DIAGNOSIS — E559 Vitamin D deficiency, unspecified: Secondary | ICD-10-CM | POA: Diagnosis not present

## 2020-08-10 NOTE — Patient Instructions (Signed)

## 2020-08-10 NOTE — Progress Notes (Addendum)
Subjective:     Sydney Thompson is a 59 y.o. female and is here for a comprehensive physical exam. The patient reports no problems.  She would like her flu shot today   Social History   Socioeconomic History  . Marital status: Single    Spouse name: Not on file  . Number of children: Not on file  . Years of education: Not on file  . Highest education level: Not on file  Occupational History  . 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 Smoking status: Never Smoker  . Smokeless tobacco: Never Used  Substance and Sexual Activity  . Alcohol use: No  . Drug use: No  . Sexual activity: Yes    Partners: Female    Birth control/protection: None  Other Topics Concern  . Not on file  Social History Narrative   Exercise-- no   Social Determinants of Health   Financial Resource Strain:   . Difficulty of Paying Living Expenses: Not on file  Food Insecurity:   . Worried About Charity fundraiser in the Last Year: Not on file  . Ran Out of Food in the Last Year: Not on file  Transportation Needs:   . Lack of Transportation (Medical): Not on file  . Lack of Transportation (Non-Medical): Not on file  Physical Activity:   . Days of Exercise per Week: Not on file  . Minutes of Exercise per Session: Not on file  Stress:   . Feeling of Stress : Not on file  Social Connections:   . Frequency of Communication with Friends and Family: Not on file  . Frequency of Social Gatherings with Friends and Family: Not on file  . Attends Religious Services: Not on file  . Active Member of Clubs or Organizations: Not on file  . Attends Archivist Meetings: Not on file  . Marital Status: Not on file  Intimate Partner Violence:   . Fear of Current or Ex-Partner: Not on file  . Emotionally Abused: Not on file  . Physically Abused: Not on file  . Sexually Abused: Not on file   Health Maintenance  Topic Date Due  . PAP SMEAR-Modifier  11/17/2014  . TETANUS/TDAP   02/21/2020  . COLONOSCOPY  05/02/2020  . MAMMOGRAM  07/14/2020  . Hepatitis C Screening  08/16/2024 (Originally 1961-02-26)  . HIV Screening  08/16/2024 (Originally 11[birth year]06/1974)  . URINE MICROALBUMIN  08/10/2021  . INFLUENZA VACCINE  Completed  . COVID-19 Vaccine  Completed    The following portions of the patient's history were reviewed and updated as appropriate:  She  has a past medical history of Depression, GERD (gastroesophageal reflux disease), and IBS (irritable bowel syndrome). She does not have any pertinent problems on file. She  has a past surgical history that includes Cholecystectomy; Lithotripsy; and Gallbladder surgery. Her family history includes Alzheimer's disease in her mother; Cancer in her mother; Diabetes in her father, paternal uncle, paternal uncle, and paternal uncle; Heart disease in her father; Hyperlipidemia in her father; Stroke in her father. She  reports that she has never smoked. She has never used smokeless tobacco. She reports that she does not drink alcohol and does not use drugs. She has a current medication list which includes the following prescription(s): acetic acid-hydrocortisone, fenofibrate, gabapentin, lovastatin, multiple vitamin, nystatin-triamcinolone, fish oil, sertraline, and vitamin d (ergocalciferol). Current Outpatient Medications on File Prior to Visit  Medication Sig Dispense Refill  . acetic acid-hydrocortisone (VOSOL-HC) OTIC solution Place 3  drops into both ears 2 (two) times daily. 10 mL 0  . fenofibrate 160 MG tablet Take 1 tablet by mouth once daily 90 tablet 1  . gabapentin (NEURONTIN) 300 MG capsule Take 1 capsule (300 mg total) by mouth at bedtime. 90 capsule 3  . lovastatin (MEVACOR) 20 MG tablet TAKE 1 TABLET BY MOUTH AT BEDTIME 90 tablet 1  . Multiple Vitamin (MULTI-VITAMIN DAILY PO) Take by mouth.    . nystatin-triamcinolone (MYCOLOG II) cream Apply 1 application topically 2 (two) times daily. 60 g 1  . Omega-3 Fatty Acids  (FISH OIL) 1000 MG CAPS Take by mouth.    . sertraline (ZOLOFT) 100 MG tablet TAKE 1 AND 1/2 TABLETS(150 MG) BY MOUTH DAILY 135 tablet 1  . Vitamin D, Ergocalciferol, (DRISDOL) 1.25 MG (50000 UNIT) CAPS capsule Take 1 capsule (50,000 Units total) by mouth every 7 (seven) days. 12 capsule 2   No current facility-administered medications on file prior to visit.   She has No Known Allergies..  Review of Systems Review of Systems  Constitutional: Negative for activity change, appetite change and fatigue.  HENT: Negative for hearing loss, congestion, tinnitus and ear discharge.  dentist q98m Eyes: Negative for visual disturbance (see optho q1y -- due) Respiratory: Negative for cough, chest tightness and shortness of breath.   Cardiovascular: Negative for chest pain, palpitations and leg swelling.  Gastrointestinal: Negative for abdominal pain, diarrhea, constipation and abdominal distention.  Genitourinary: Negative for urgency, frequency, decreased urine volume and difficulty urinating.  Musculoskeletal: Negative for back pain, arthralgias and gait problem.  Skin: Negative for color change, pallor and rash.  Neurological: Negative for dizziness, light-headedness, numbness and headaches.  Hematological: Negative for adenopathy. Does not bruise/bleed easily.  Psychiatric/Behavioral: Negative for suicidal ideas, confusion, sleep disturbance, self-injury, dysphoric mood, decreased concentration and agitation.       Objective:    BP 108/70 (BP Location: Left Arm, Patient Position: Sitting, Cuff Size: Large)   Pulse 71   Temp 98.1 F (36.7 C) (Oral)   Resp 18   Ht 5\' 2"  (1.575 m)   Wt 233 lb 12.8 oz (106.1 kg)   SpO2 97%   BMI 42.76 kg/m  General appearance: alert, cooperative, appears stated age and no distress Head: Normocephalic, without obvious abnormality, atraumatic Eyes: negative findings: lids and lashes normal, conjunctivae and sclerae normal, corneas clear and pupils equal,  round, reactive to light and accomodation Ears: normal TM's and external ear canals both ears Neck: no adenopathy, no carotid bruit, no JVD, supple, symmetrical, trachea midline and thyroid not enlarged, symmetric, no tenderness/mass/nodules Back: symmetric, no curvature. ROM normal. No CVA tenderness. Lungs: clear to auscultation bilaterally Breasts: normal appearance, no masses or tenderness Heart: regular rate and rhythm, S1, S2 normal, no murmur, click, rub or gallop Abdomen: soft, non-tender; bowel sounds normal; no masses,  no organomegaly Pelvic: pt refused Extremities: extremities normal, atraumatic, no cyanosis or edema Pulses: 2+ and symmetric Skin: Skin color, texture, turgor normal. No rashes or lesions Lymph nodes: Cervical, supraclavicular, and axillary nodes normal. Neurologic: Alert and oriented X 3, normal strength and tone. Normal symmetric reflexes. Normal coordination and gait    Assessment:    Healthy female exam.      Plan:    ghm utd Check labs  See After Visit Summary for Counseling Recommendations    1. Hypotension  stable  - Lipid panel - Comprehensive metabolic panel - CBC with Differential/Platelet - TSH - Microalbumin / creatinine urine ratio  2. Hyperlipidemia, unspecified  hyperlipidemia type Encouraged heart healthy diet, increase exercise, avoid trans fats, consider a krill oil cap daily - Lipid panel - Comprehensive metabolic panel - CBC with Differential/Platelet - TSH  3. Preventative health care See above  Pt will get mammogram , flu shot today and tetanus at phamacy  Colon ordered  - Lipid panel - Comprehensive metabolic panel - CBC with Differential/Platelet - TSH  4. Need for influenza vaccination   - Flu Vaccine QUAD 36+ mos IM  5. Colon cancer screening   - Ambulatory referral to Gastroenterology  6. Vitamin D deficiency   - Vitamin D (25 hydroxy)

## 2020-08-11 LAB — LIPID PANEL
Cholesterol: 140 mg/dL (ref <200)
HDL: 42 mg/dL — ABNORMAL LOW (ref >=50)
LDL Cholesterol (Calc): 76 mg/dL (calc)
Non-HDL Cholesterol (Calc): 98 mg/dL (calc) (ref <130)
Total CHOL/HDL Ratio: 3.3 (calc) (ref <5.0)
Triglycerides: 138 mg/dL (ref <150)

## 2020-08-11 LAB — COMPREHENSIVE METABOLIC PANEL
AG Ratio: 1.5 (calc) (ref 1.0–2.5)
ALT: 22 U/L (ref 6–29)
AST: 30 U/L (ref 10–35)
Albumin: 4.4 g/dL (ref 3.6–5.1)
Alkaline phosphatase (APISO): 48 U/L (ref 37–153)
BUN: 20 mg/dL (ref 7–25)
CO2: 21 mmol/L (ref 20–32)
Calcium: 9.7 mg/dL (ref 8.6–10.4)
Chloride: 107 mmol/L (ref 98–110)
Creat: 0.81 mg/dL (ref 0.50–0.99)
Globulin: 3 g/dL (calc) (ref 1.9–3.7)
Glucose, Bld: 94 mg/dL (ref 65–99)
Potassium: 4.2 mmol/L (ref 3.5–5.3)
Sodium: 139 mmol/L (ref 135–146)
Total Bilirubin: 0.5 mg/dL (ref 0.2–1.2)
Total Protein: 7.4 g/dL (ref 6.1–8.1)

## 2020-08-11 LAB — CBC WITH DIFFERENTIAL/PLATELET
Absolute Monocytes: 604 cells/uL (ref 200–950)
Basophils Absolute: 43 cells/uL (ref 0–200)
Basophils Relative: 0.5 %
Eosinophils Absolute: 111 cells/uL (ref 15–500)
Eosinophils Relative: 1.3 %
HCT: 36.5 % (ref 35.0–45.0)
Hemoglobin: 12.2 g/dL (ref 11.7–15.5)
Lymphs Abs: 1233 cells/uL (ref 850–3900)
MCH: 31.9 pg (ref 27.0–33.0)
MCHC: 33.4 g/dL (ref 32.0–36.0)
MCV: 95.5 fL (ref 80.0–100.0)
MPV: 11.2 fL (ref 7.5–12.5)
Monocytes Relative: 7.1 %
Neutro Abs: 6511 cells/uL (ref 1500–7800)
Neutrophils Relative %: 76.6 %
Platelets: 211 10*3/uL (ref 140–400)
RBC: 3.82 10*6/uL (ref 3.80–5.10)
RDW: 12.1 % (ref 11.0–15.0)
Total Lymphocyte: 14.5 %
WBC: 8.5 10*3/uL (ref 3.8–10.8)

## 2020-08-11 LAB — MICROALBUMIN / CREATININE URINE RATIO
Creatinine, Urine: 240 mg/dL (ref 20–275)
Microalb Creat Ratio: 44 mcg/mg creat — ABNORMAL HIGH (ref <30)
Microalb, Ur: 10.6 mg/dL

## 2020-08-11 LAB — VITAMIN D 25 HYDROXY (VIT D DEFICIENCY, FRACTURES): Vit D, 25-Hydroxy: 58 ng/mL (ref 30–100)

## 2020-08-11 LAB — TSH: TSH: 2.87 mIU/L (ref 0.40–4.50)

## 2020-08-14 ENCOUNTER — Telehealth: Payer: Self-pay | Admitting: Family Medicine

## 2020-08-14 NOTE — Telephone Encounter (Signed)
Patient was seeing on 08/10/20 she wants to know why under description it states hypertension.   Patient would like labs to be mailed to her.

## 2020-08-15 NOTE — Telephone Encounter (Signed)
Pt not on HTN meds and BP was on the low side at the recent visit. Should this have been hypotension? Please advise

## 2020-08-15 NOTE — Telephone Encounter (Signed)
Pt called. LVM. Will mail labs once reviewed by [facility]

## 2020-08-15 NOTE — Telephone Encounter (Signed)
I changed it 

## 2020-08-16 ENCOUNTER — Other Ambulatory Visit: Payer: Self-pay | Admitting: Family Medicine

## 2020-08-16 DIAGNOSIS — D649 Anemia, unspecified: Secondary | ICD-10-CM

## 2020-08-16 DIAGNOSIS — I10 Essential (primary) hypertension: Secondary | ICD-10-CM

## 2020-08-23 NOTE — Telephone Encounter (Signed)
Patient has not received her lab results in the mail yet, patient is requesting to send another copy   Please

## 2020-08-24 NOTE — Telephone Encounter (Signed)
Labs placed in mail

## 2020-11-06 ENCOUNTER — Other Ambulatory Visit: Payer: Self-pay | Admitting: Family Medicine

## 2020-11-06 DIAGNOSIS — E785 Hyperlipidemia, unspecified: Secondary | ICD-10-CM

## 2020-12-26 DIAGNOSIS — D125 Benign neoplasm of sigmoid colon: Secondary | ICD-10-CM | POA: Diagnosis not present

## 2020-12-26 DIAGNOSIS — K5140 Inflammatory polyps of colon without complications: Secondary | ICD-10-CM | POA: Diagnosis not present

## 2020-12-26 DIAGNOSIS — Z1211 Encounter for screening for malignant neoplasm of colon: Secondary | ICD-10-CM | POA: Diagnosis not present

## 2020-12-26 DIAGNOSIS — K635 Polyp of colon: Secondary | ICD-10-CM | POA: Diagnosis not present

## 2020-12-26 DIAGNOSIS — K648 Other hemorrhoids: Secondary | ICD-10-CM | POA: Diagnosis not present

## 2020-12-26 LAB — HM COLONOSCOPY

## 2021-01-11 DIAGNOSIS — L400 Psoriasis vulgaris: Secondary | ICD-10-CM | POA: Diagnosis not present

## 2021-01-11 DIAGNOSIS — L4050 Arthropathic psoriasis, unspecified: Secondary | ICD-10-CM | POA: Diagnosis not present

## 2021-01-14 DIAGNOSIS — L400 Psoriasis vulgaris: Secondary | ICD-10-CM | POA: Diagnosis not present

## 2021-01-14 DIAGNOSIS — Z79899 Other long term (current) drug therapy: Secondary | ICD-10-CM | POA: Diagnosis not present

## 2021-01-14 DIAGNOSIS — R531 Weakness: Secondary | ICD-10-CM | POA: Diagnosis not present

## 2021-01-15 ENCOUNTER — Telehealth: Payer: Self-pay | Admitting: Family Medicine

## 2021-01-15 DIAGNOSIS — F320 Major depressive disorder, single episode, mild: Secondary | ICD-10-CM

## 2021-01-15 DIAGNOSIS — M79641 Pain in right hand: Secondary | ICD-10-CM

## 2021-01-15 DIAGNOSIS — E785 Hyperlipidemia, unspecified: Secondary | ICD-10-CM

## 2021-01-15 MED ORDER — GABAPENTIN 300 MG PO CAPS: 300.0000 mg | ORAL_CAPSULE | Freq: Every day | ORAL | 3 refills | Status: DC

## 2021-01-15 MED ORDER — LOVASTATIN 20 MG PO TABS: 20.0000 mg | ORAL_TABLET | Freq: Every day | ORAL | 1 refills | Status: DC

## 2021-01-15 MED ORDER — SERTRALINE HCL 100 MG PO TABS: ORAL_TABLET | ORAL | 1 refills | Status: DC

## 2021-01-15 MED ORDER — FENOFIBRATE 160 MG PO TABS: 160.0000 mg | ORAL_TABLET | Freq: Every day | ORAL | 1 refills | Status: DC

## 2021-01-15 NOTE — Telephone Encounter (Signed)
Refills sent to [facility].

## 2021-01-15 NOTE — Telephone Encounter (Signed)
Patient  states that she is having all meds sent to [facility] told patient that they sent [place] info to have all meds info sent to them and they never heard from [place].. Do you recalll or can you confirm? Patient is in need of meds to be approved to be moved. Please advise thanks

## 2021-01-28 ENCOUNTER — Other Ambulatory Visit: Payer: Self-pay | Admitting: Family Medicine

## 2021-01-31 ENCOUNTER — Other Ambulatory Visit: Payer: Self-pay

## 2021-01-31 DIAGNOSIS — F320 Major depressive disorder, single episode, mild: Secondary | ICD-10-CM

## 2021-01-31 MED ORDER — SERTRALINE HCL 100 MG PO TABS: ORAL_TABLET | ORAL | 1 refills | Status: DC

## 2021-02-12 ENCOUNTER — Ambulatory Visit: Payer: Medicare Other | Attending: Internal Medicine

## 2021-02-12 ENCOUNTER — Other Ambulatory Visit: Payer: Self-pay

## 2021-02-12 ENCOUNTER — Ambulatory Visit (INDEPENDENT_AMBULATORY_CARE_PROVIDER_SITE_OTHER): Payer: Medicare Other | Admitting: Family Medicine

## 2021-02-12 VITALS — BP 110/72 | HR 63 | Temp 97.9°F | Resp 18 | Ht 62.0 in | Wt 210.8 lb

## 2021-02-12 DIAGNOSIS — E785 Hyperlipidemia, unspecified: Secondary | ICD-10-CM | POA: Diagnosis not present

## 2021-02-12 DIAGNOSIS — Z0000 Encounter for general adult medical examination without abnormal findings: Secondary | ICD-10-CM

## 2021-02-12 DIAGNOSIS — D509 Iron deficiency anemia, unspecified: Secondary | ICD-10-CM | POA: Diagnosis not present

## 2021-02-12 DIAGNOSIS — E1169 Type 2 diabetes mellitus with other specified complication: Secondary | ICD-10-CM

## 2021-02-12 DIAGNOSIS — G5603 Carpal tunnel syndrome, bilateral upper limbs: Secondary | ICD-10-CM

## 2021-02-12 DIAGNOSIS — E559 Vitamin D deficiency, unspecified: Secondary | ICD-10-CM

## 2021-02-12 DIAGNOSIS — M722 Plantar fascial fibromatosis: Secondary | ICD-10-CM | POA: Diagnosis not present

## 2021-02-12 DIAGNOSIS — M720 Palmar fascial fibromatosis [Dupuytren]: Secondary | ICD-10-CM

## 2021-02-12 DIAGNOSIS — F320 Major depressive disorder, single episode, mild: Secondary | ICD-10-CM

## 2021-02-12 DIAGNOSIS — Z23 Encounter for immunization: Secondary | ICD-10-CM

## 2021-02-12 LAB — CBC WITH DIFFERENTIAL/PLATELET
Basophils Absolute: 0 10*3/uL (ref 0.0–0.1)
Basophils Relative: 0.6 % (ref 0.0–3.0)
Eosinophils Absolute: 0.1 10*3/uL (ref 0.0–0.7)
Eosinophils Relative: 2.2 % (ref 0.0–5.0)
HCT: 36.2 % (ref 36.0–46.0)
Hemoglobin: 12.3 g/dL (ref 12.0–15.0)
Lymphocytes Relative: 22.3 % (ref 12.0–46.0)
Lymphs Abs: 1.2 10*3/uL (ref 0.7–4.0)
MCHC: 33.8 g/dL (ref 30.0–36.0)
MCV: 96.2 fl (ref 78.0–100.0)
Monocytes Absolute: 0.5 10*3/uL (ref 0.1–1.0)
Monocytes Relative: 9.4 % (ref 3.0–12.0)
Neutro Abs: 3.5 10*3/uL (ref 1.4–7.7)
Neutrophils Relative %: 65.5 % (ref 43.0–77.0)
Platelets: 177 10*3/uL (ref 150.0–400.0)
RBC: 3.77 Mil/uL — ABNORMAL LOW (ref 3.87–5.11)
RDW: 13.4 % (ref 11.5–15.5)
WBC: 5.3 10*3/uL (ref 4.0–10.5)

## 2021-02-12 LAB — IBC + FERRITIN
Ferritin: 120.1 ng/mL (ref 10.0–291.0)
Iron: 125 ug/dL (ref 42–145)
Saturation Ratios: 26.7 % (ref 20.0–50.0)
Transferrin: 334 mg/dL (ref 212.0–360.0)

## 2021-02-12 LAB — VITAMIN D 25 HYDROXY (VIT D DEFICIENCY, FRACTURES): VITD: 40.35 ng/mL (ref 30.00–100.00)

## 2021-02-12 NOTE — Assessment & Plan Note (Signed)
Tolerating statin, encouraged heart healthy diet, avoid trans fats, minimize simple carbs and saturated fats. Increase exercise as tolerated 

## 2021-02-12 NOTE — Assessment & Plan Note (Signed)
ghm utd Check labs See AVS 

## 2021-02-12 NOTE — Progress Notes (Signed)
Subjective:   By signing my name below, I, Shehryar Baig, attest that this documentation has been prepared under the direction and in the presence of  Dr. Roma Schanz, DO. 02/12/2021    Patient ID: Sydney Thompson, female    DOB: 1961-08-20, 61 y.o.   MRN: [id number]  Chief Complaint  Patient presents with  . Follow-up    6 month- tlc,rma Concerns/questions: Pt needs referrals for Podiatry and hand specialist in [place]. Wants to know if she should be taking Vit D still.  . Hyperlipidemia    Taking Lovastatin 20 mg and Fenofibrate 160 mg  . Hypotension    No new complaints    HPI Patient is in today for a office visit. She reports having foot pain from her plantar fibroma that has progressively worsened. She request to see a podiatrist to manage her plantar fibroma. She also mentions she has carpal tunnel and dupuytren's  and would like to make an appointment with a hand specialist for treatment. She mentions she had a colonoscopy in march and they found 1 polyp. Her dermatologist is treating her cirrhosis every 2 months.  Today she requests for a refill on her vitamin D2 suppliment. She is UTD on her Covid 19 vaccinations. She is willing to get her Covid 19 booster vaccine today.She denies any leg swelling, cough, fever, nausea, vomiting, diarrhea, fever, congestion, sore throat, chest pain, shortness of breath, dysuria, and headache at this time. Otherwise she is doing well.  Past Medical History:  Diagnosis Date  . Depression   . GERD (gastroesophageal reflux disease)   . IBS (irritable bowel syndrome)     Past Surgical History:  Procedure Laterality Date  . CHOLECYSTECTOMY    . GALLBLADDER SURGERY    . LITHOTRIPSY     Dr. Terance Hart    Family History  Problem Relation Age of Onset  . Cancer Mother        breast  . Alzheimer's disease Mother   . Stroke Father   . Heart disease Father        MI  . Hyperlipidemia Father   . Diabetes Father   . Diabetes  Paternal Uncle   . Diabetes Paternal Uncle   . Diabetes Paternal Uncle     Social History   Socioeconomic History  . Marital status: Single    Spouse name: Not on file  . Number of children: Not on file  . Years of education: Not on file  . Highest education level: Not on file  Occupational History  . 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 Smoking status: Never Smoker  . Smokeless tobacco: Never Used  Substance and Sexual Activity  . Alcohol use: No  . Drug use: No  . Sexual activity: Yes    Partners: Female    Birth control/protection: None  Other Topics Concern  . Not on file  Social History Narrative   Exercise-- no   Social Determinants of Health   Financial Resource Strain: Not on file  Food Insecurity: Not on file  Transportation Needs: Not on file  Physical Activity: Not on file  Stress: Not on file  Social Connections: Not on file  Intimate Partner Violence: Not on file    Outpatient Medications Prior to Visit  Medication Sig Dispense Refill  . acetic acid-hydrocortisone (VOSOL-HC) OTIC solution Place 3 drops into both ears 2 (two) times daily. 10 mL 0  . fenofibrate 160 MG tablet Take 1 tablet (160 mg total)  by mouth daily. 90 tablet 1  . gabapentin (NEURONTIN) 300 MG capsule Take 1 capsule (300 mg total) by mouth at bedtime. 90 capsule 3  . lovastatin (MEVACOR) 20 MG tablet Take 1 tablet (20 mg total) by mouth at bedtime. 90 tablet 1  . Multiple Vitamin (MULTI-VITAMIN DAILY PO) Take by mouth.    . nystatin-triamcinolone (MYCOLOG II) cream Apply 1 application topically 2 (two) times daily. 60 g 1  . Omega-3 Fatty Acids (FISH OIL) 1000 MG CAPS Take by mouth.    . sertraline (ZOLOFT) 100 MG tablet TAKE 1 AND 1/2 TABLETS(150 MG) BY MOUTH DAILY 135 tablet 1  . Vitamin D, Ergocalciferol, (DRISDOL) 1.25 MG (50000 UNIT) CAPS capsule Take 1 capsule (50,000 Units total) by mouth every 7 (seven) days. 12 capsule 2  . TREMFYA 100 MG/ML SOSY Inject  1 Syringe into the skin every 8 (eight) weeks.     No facility-administered medications prior to visit.    No Known Allergies  Review of Systems  Constitutional: Negative for chills and fever.  HENT: Negative for congestion, ear pain, sinus pain and sore throat.   Eyes: Negative for pain.  Respiratory: Negative for cough, shortness of breath and wheezing.   Cardiovascular: Negative for chest pain, palpitations and leg swelling.  Gastrointestinal: Negative for abdominal pain, diarrhea, nausea and vomiting.  Genitourinary: Negative for dysuria.  Neurological: Negative for dizziness and headaches.       Objective:    Physical Exam Vitals and nursing note reviewed.  Constitutional:      General: She is not in acute distress.    Appearance: Normal appearance. She is not ill-appearing.  HENT:     Head: Normocephalic and atraumatic.     Right Ear: External ear normal.     Left Ear: External ear normal.  Eyes:     Extraocular Movements: Extraocular movements intact.     Pupils: Pupils are equal, round, and reactive to light.  Cardiovascular:     Rate and Rhythm: Normal rate and regular rhythm.     Pulses: Normal pulses.     Heart sounds: Normal heart sounds. No murmur heard.   Pulmonary:     Effort: Pulmonary effort is normal. No respiratory distress.     Breath sounds: Normal breath sounds. No wheezing, rhonchi or rales.  Skin:    General: Skin is warm and dry.  Neurological:     Mental Status: She is alert and oriented to person, place, and time.  Psychiatric:        Behavior: Behavior normal.     BP 110/72 (BP Location: Left Arm, Patient Position: Sitting, Cuff Size: Large)   Pulse 63   Temp 97.9 F (36.6 C) (Oral)   Resp 18   Ht 5\' 2"  (1.575 m)   Wt 210 lb 12.8 oz (95.6 kg)   SpO2 97%   BMI 38.56 kg/m  Wt Readings from Last 3 Encounters:  02/12/21 210 lb 12.8 oz (95.6 kg)  08/10/20 233 lb 12.8 oz (106.1 kg)  05/07/20 234 lb 12.8 oz (106.5 kg)    Diabetic  Foot Exam - Simple   No data filed    Lab Results  Component Value Date   WBC 5.3 02/12/2021   HGB 12.3 02/12/2021   HCT 36.2 02/12/2021   PLT 177.0 02/12/2021   GLUCOSE 94 08/10/2020   CHOL 140 08/10/2020   TRIG 138 08/10/2020   HDL 42 (L) 08/10/2020   LDLDIRECT 166.0 08/03/2018   LDLCALC 76 08/10/2020  ALT 22 08/10/2020   AST 30 08/10/2020   NA 139 08/10/2020   K 4.2 08/10/2020   CL 107 08/10/2020   CREATININE 0.81 08/10/2020   BUN 20 08/10/2020   CO2 21 08/10/2020   TSH 2.87 08/10/2020   INR 1.0 07/17/2009   HGBA1C 5.9 08/16/2018   MICROALBUR 10.6 08/10/2020    Lab Results  Component Value Date   TSH 2.87 08/10/2020   Lab Results  Component Value Date   WBC 5.3 02/12/2021   HGB 12.3 02/12/2021   HCT 36.2 02/12/2021   MCV 96.2 02/12/2021   PLT 177.0 02/12/2021   Lab Results  Component Value Date   NA 139 08/10/2020   K 4.2 08/10/2020   CO2 21 08/10/2020   GLUCOSE 94 08/10/2020   BUN 20 08/10/2020   CREATININE 0.81 08/10/2020   BILITOT 0.5 08/10/2020   ALKPHOS 43 05/07/2020   AST 30 08/10/2020   ALT 22 08/10/2020   PROT 7.4 08/10/2020   ALBUMIN 4.3 05/07/2020   CALCIUM 9.7 08/10/2020   GFR 72.00 05/07/2020   Lab Results  Component Value Date   CHOL 140 08/10/2020   Lab Results  Component Value Date   HDL 42 (L) 08/10/2020   Lab Results  Component Value Date   LDLCALC 76 08/10/2020   Lab Results  Component Value Date   TRIG 138 08/10/2020   Lab Results  Component Value Date   CHOLHDL 3.3 08/10/2020   Lab Results  Component Value Date   HGBA1C 5.9 08/16/2018       Assessment & Plan:   Problem List Items Addressed This Visit      Unprioritized   CARPAL TUNNEL SYNDROME, BILATERAL    Refer to ortho      Relevant Orders   Ambulatory referral to Orthopedic Surgery   RESOLVED: Hyperlipidemia associated with type 2 diabetes mellitus ([place])   Hyperlipidemia LDL goal <100    Tolerating statin, encouraged heart healthy diet, avoid  trans fats, minimize simple carbs and saturated fats. Increase exercise as tolerated      Preventative health care    ghm utd Check labs  See AVS       Other Visit Diagnoses    Vitamin D deficiency    -  Primary   Relevant Orders   Vitamin D (25 hydroxy) (Completed)   Dupuytren's contracture of both hands       Relevant Orders   Ambulatory referral to Orthopedic Surgery   Fibromatosis, plantar       Relevant Orders   Ambulatory referral to Podiatry   Iron deficiency anemia, unspecified iron deficiency anemia type       Relevant Orders   CBC with Differential/Platelet (Completed)   IBC + Ferritin (Completed)   Depression, major, single episode, mild (HCC)   (Chronic)        No orders of the defined types were placed in this encounter.   IAnn Held, DO, personally preformed the services described in this documentation.  All medical record entries made by the scribe were at my direction and in my presence.  I have reviewed the chart and discharge instructions (if applicable) and agree that the record reflects my personal performance and is accurate and complete. 02/12/2021   I,Shehryar Baig,acting as a scribe for Ann Held, DO.,have documented all relevant documentation on the behalf of Ann Held, DO,as directed by  Ann Held, DO while in the presence of Alferd Apa  Lowne Chase, DO.   Ann Held, DO

## 2021-02-12 NOTE — Patient Instructions (Signed)

## 2021-02-12 NOTE — Progress Notes (Signed)
   Covid-19 Vaccination Clinic  Name:  Sydney Thompson    MRN: [id number] DOB: [birth year]  02/12/2021  Sydney Thompson was observed post Covid-19 immunization for 15 minutes without incident. She was provided with Vaccine Information Sheet and instruction to access the V-Safe system.   Sydney Thompson was instructed to call 911 with any severe reactions post vaccine: Marland Kitchen Difficulty breathing  . Swelling of face and throat  . A fast heartbeat  . A bad rash all over body  . Dizziness and weakness   Immunizations Administered    Name Date Dose VIS Date Route   Moderna Covid-19 Booster Vaccine 02/12/2021  2:02 PM 0.25 mL 08/15/2020 Intramuscular   Manufacturer: Moderna   Lot: [id number]   [place]: 28413-244-01

## 2021-02-12 NOTE — Assessment & Plan Note (Signed)
Refer to ortho.

## 2021-02-12 NOTE — Assessment & Plan Note (Deleted)
Tolerating statin, encouraged heart healthy diet, avoid trans fats, minimize simple carbs and saturated fats. Increase exercise as tolerated 

## 2021-02-15 ENCOUNTER — Other Ambulatory Visit (HOSPITAL_BASED_OUTPATIENT_CLINIC_OR_DEPARTMENT_OTHER): Payer: Self-pay

## 2021-02-15 MED ORDER — MODERNA COVID-19 VACCINE 100 MCG/0.5ML IM SUSP
INTRAMUSCULAR | 0 refills | Status: DC
  Filled 2021-02-15: qty 0.25, 1d supply, fill #0

## 2021-02-18 ENCOUNTER — Other Ambulatory Visit: Payer: Self-pay

## 2021-02-18 MED ORDER — VITAMIN D (ERGOCALCIFEROL) 1.25 MG (50000 UNIT) PO CAPS: 50000.0000 [IU] | ORAL_CAPSULE | ORAL | 2 refills | Status: DC

## 2021-03-08 DIAGNOSIS — L400 Psoriasis vulgaris: Secondary | ICD-10-CM | POA: Diagnosis not present

## 2021-03-18 ENCOUNTER — Other Ambulatory Visit: Payer: Self-pay

## 2021-03-18 ENCOUNTER — Ambulatory Visit: Payer: Medicare Other | Admitting: Podiatry

## 2021-03-18 ENCOUNTER — Ambulatory Visit (INDEPENDENT_AMBULATORY_CARE_PROVIDER_SITE_OTHER): Payer: Medicare Other

## 2021-03-18 DIAGNOSIS — M722 Plantar fascial fibromatosis: Secondary | ICD-10-CM

## 2021-03-18 DIAGNOSIS — D219 Benign neoplasm of connective and other soft tissue, unspecified: Secondary | ICD-10-CM

## 2021-03-18 NOTE — Progress Notes (Signed)
  Subjective:  Patient ID: Beola Cord, female    DOB: 01-Mar-1961,  MRN: [id number]  Chief Complaint  Patient presents with  . fibroma    Fibormas at Lt medial arch x years but pain recently started, getting bigger; 9/10 shooting pian -worse when relaxing and laying down Tx; none -pt also c/o itching at Rt mediabl arch with a lesion formation     60 y.o. female presents with the above complaint. History confirmed with patient.   Objective:  Physical Exam: warm, good capillary refill, no trophic changes or ulcerative lesions, normal DP and PT pulses and normal sensory exam. Left Foot: large fibrous lesion medial cord of the plantar fascia approx 2.5 cm in diameter with POP. No POP along the remainder of the plantar fascia  Right Foot: small nodules medial cord plantar fascia just proximal to the sesamoid complex.   No images are attached to the encounter.  Radiographs: X-ray of the left foot: no fracture, dislocation, swelling or degenerative changes noted Assessment:   1. Plantar fibromatosis   2. Fibroma    Plan:  Patient was evaluated and treated and all questions answered.  Plantar Fibroma left -XR reviewed with patient -Educated on etiology -Discussed treatment options including injection vs excision. I think based upon size injection is unlikely to resolve the issue. We did discuss recurrence even with surgery. -Will f/u in 1 month for possible surgical discussion.  No follow-ups on file.

## 2021-03-19 ENCOUNTER — Ambulatory Visit: Payer: Medicare Other | Admitting: Sports Medicine

## 2021-03-27 ENCOUNTER — Telehealth: Payer: Self-pay | Admitting: Family Medicine

## 2021-03-27 ENCOUNTER — Other Ambulatory Visit: Payer: Self-pay

## 2021-03-27 NOTE — Telephone Encounter (Signed)
Pt, called she would like referral for a mammography in [place]  Please advice

## 2021-03-28 NOTE — Telephone Encounter (Signed)
She does not need a referral --- she should be able to call and schedule

## 2021-04-01 NOTE — Telephone Encounter (Signed)
Pt called. LVM to return call 

## 2021-04-04 DIAGNOSIS — L400 Psoriasis vulgaris: Secondary | ICD-10-CM | POA: Diagnosis not present

## 2021-04-04 DIAGNOSIS — L4050 Arthropathic psoriasis, unspecified: Secondary | ICD-10-CM | POA: Diagnosis not present

## 2021-04-10 ENCOUNTER — Telehealth: Payer: Self-pay | Admitting: Pulmonary Disease

## 2021-04-10 NOTE — Telephone Encounter (Signed)
Pt has not been seen in almost 10 yrs last OV w/ Dr. Elsworth Soho was 05/2012 did inform pt she is not considered a pt anymore and would need to schedule a sleep consult to reestablish care, however Dr. Elsworth Soho does not have any available dates currently to schedule did inform pt she could back in a few wks because we should have it for July by then towards the end of this month however pt was wanting to obtain her old sleep study results just in case she establishes care with a sleep doctor in [place] and wanted me to send a message back in regards to seeing if that was possible. Pls regard; [phone]

## 2021-04-11 NOTE — Telephone Encounter (Signed)
LMTCB

## 2021-04-11 NOTE — Telephone Encounter (Signed)
Patient is returning phone call. Patient phone number is [phone].

## 2021-04-11 NOTE — Telephone Encounter (Signed)
Called her again and still not answering- LMTCB

## 2021-04-11 NOTE — Telephone Encounter (Signed)
Called and spoke with pt to verify her address on file to have the sleep study results sent to. Results have been placed in mail to pt. Nothing further needed.

## 2021-04-11 NOTE — Telephone Encounter (Signed)
Patient is returning phone call. Patient phone number is [phone].

## 2021-04-18 ENCOUNTER — Encounter: Payer: Self-pay | Admitting: Podiatry

## 2021-04-18 ENCOUNTER — Ambulatory Visit (INDEPENDENT_AMBULATORY_CARE_PROVIDER_SITE_OTHER): Payer: Medicare Other | Admitting: Podiatry

## 2021-04-18 ENCOUNTER — Other Ambulatory Visit: Payer: Self-pay

## 2021-04-18 DIAGNOSIS — M79672 Pain in left foot: Secondary | ICD-10-CM

## 2021-04-18 DIAGNOSIS — D219 Benign neoplasm of connective and other soft tissue, unspecified: Secondary | ICD-10-CM | POA: Diagnosis not present

## 2021-04-18 DIAGNOSIS — M722 Plantar fascial fibromatosis: Secondary | ICD-10-CM

## 2021-04-18 NOTE — Progress Notes (Signed)
  Subjective:  Patient ID: Sydney Thompson, female    DOB: 15-Oct-1961,  MRN: [id number]  Chief Complaint  Patient presents with   Foot Problem    I am about the same and does get sore and tender    60 y.o. female presents with the above complaint. History confirmed with patient. Ready to discuss surgical intervention.  Objective:  Physical Exam: warm, good capillary refill, no trophic changes or ulcerative lesions, normal DP and PT pulses and normal sensory exam. Left Foot: large fibrous lesion medial Thompson of the plantar fascia approx 2.5 cm in diameter with POP. No POP along the remainder of the plantar fascia  Right Foot: small nodules medial Thompson plantar fascia just proximal to the sesamoid complex.    Assessment:   1. Plantar fibromatosis   2. Fibroma   3. Pain in left foot     Plan:  Patient was evaluated and treated and all questions answered.  Plantar Fibroma left -Patient has failed all conservative therapy and wishes to proceed with surgical intervention. All risks, benefits, and alternatives discussed with patient. No guarantees given. Consent reviewed and signed by patient. -Planned procedures: excision of plantar fibroma left foto -ASA 2 - Patient with mild systemic disease with no functional limitations -Post-op anticoagulation: chemoprophylaxis not indicated -DME dispensed for post-op use: CAM Boot  Return for Post-op Care.

## 2021-04-22 ENCOUNTER — Telehealth: Payer: Self-pay | Admitting: Urology

## 2021-04-22 NOTE — Telephone Encounter (Signed)
DOS - 05/01/21  PLANTAR FIBROMA LEFT --- [id number]   [facility] EFFECTIVE DATE - 10/27/20   PLAN DEDUCTIBLE - $0.00 OUT OF POCKET - $4,500.00 W/  $4,351.13 REMAINING COINSURANCE - 0% COPAY - $325.00   PER [employer] WEB SITE FOR CPT CODE 11031 Notification or Prior Authorization is not required for the requested services  Decision ID #:[id number]

## 2021-04-30 ENCOUNTER — Telehealth: Payer: Self-pay | Admitting: Podiatry

## 2021-04-30 MED ORDER — CEPHALEXIN 500 MG PO CAPS: ORAL_CAPSULE | ORAL | 0 refills | Status: DC

## 2021-04-30 MED ORDER — ONDANSETRON HCL 4 MG PO TABS: 4.0000 mg | ORAL_TABLET | Freq: Three times a day (TID) | ORAL | 0 refills | Status: DC | PRN

## 2021-04-30 MED ORDER — OXYCODONE-ACETAMINOPHEN 5-325 MG PO TABS: 1.0000 | ORAL_TABLET | ORAL | 0 refills | Status: DC | PRN

## 2021-04-30 NOTE — Telephone Encounter (Signed)
Called patient to address any pre-op concerns. All questions answered. Rx sent to pharmacy for surgery.

## 2021-05-01 ENCOUNTER — Encounter: Payer: Self-pay | Admitting: Podiatry

## 2021-05-01 DIAGNOSIS — M722 Plantar fascial fibromatosis: Secondary | ICD-10-CM | POA: Diagnosis not present

## 2021-05-01 DIAGNOSIS — D492 Neoplasm of unspecified behavior of bone, soft tissue, and skin: Secondary | ICD-10-CM | POA: Diagnosis not present

## 2021-05-01 DIAGNOSIS — D2122 Benign neoplasm of connective and other soft tissue of left lower limb, including hip: Secondary | ICD-10-CM | POA: Diagnosis not present

## 2021-05-03 DIAGNOSIS — L400 Psoriasis vulgaris: Secondary | ICD-10-CM | POA: Diagnosis not present

## 2021-05-06 ENCOUNTER — Other Ambulatory Visit: Payer: Self-pay

## 2021-05-06 ENCOUNTER — Ambulatory Visit (INDEPENDENT_AMBULATORY_CARE_PROVIDER_SITE_OTHER): Payer: Medicare Other | Admitting: Podiatry

## 2021-05-06 DIAGNOSIS — Z9889 Other specified postprocedural states: Secondary | ICD-10-CM

## 2021-05-06 DIAGNOSIS — M722 Plantar fascial fibromatosis: Secondary | ICD-10-CM

## 2021-05-06 NOTE — Progress Notes (Signed)
  Subjective:  Patient ID: Beola Cord, female    DOB: 20-Jan-1961,  MRN: [id number]  Chief Complaint  Patient presents with   Routine Post Op    POV #1 DOS 05/01/2021 LT FOOT EXCISION OF PLANTAR FIBROMA -pt states," doing better, it was hurting, now Im walking with less pain; 4/10." - dressing intact Tx: boot, elevation and oxycodone    DOS: 05/01/21 Procedure: Excision of plantar fibroma left  60 y.o. female presents with the above complaint. History confirmed with patient.   Objective:  Physical Exam: tenderness at the surgical site, local edema noted, and calf supple, nontender. Incision: healing well, no significant drainage,no significant erythema. Small area of hypergranular flesh noted.  Assessment:   1. Post-operative state   2. Plantar fibromatosis     Plan:  Patient was evaluated and treated and all questions answered.  Post-operative State -Wound cleansed. Hypergranular area cauterized with silver nitrate. -Dressing applied consisting of sterile gauze, kerlix, and ACE bandage -NWB with walker and CAM boot  -XRs needed at follow-up: none   No follow-ups on file.

## 2021-05-13 ENCOUNTER — Other Ambulatory Visit: Payer: Self-pay | Admitting: Family Medicine

## 2021-05-13 DIAGNOSIS — E785 Hyperlipidemia, unspecified: Secondary | ICD-10-CM

## 2021-05-13 DIAGNOSIS — F320 Major depressive disorder, single episode, mild: Secondary | ICD-10-CM

## 2021-05-16 ENCOUNTER — Other Ambulatory Visit: Payer: Self-pay

## 2021-05-16 ENCOUNTER — Ambulatory Visit (INDEPENDENT_AMBULATORY_CARE_PROVIDER_SITE_OTHER): Payer: Medicare Other | Admitting: Podiatry

## 2021-05-16 DIAGNOSIS — Z9889 Other specified postprocedural states: Secondary | ICD-10-CM

## 2021-05-16 DIAGNOSIS — M722 Plantar fascial fibromatosis: Secondary | ICD-10-CM

## 2021-05-16 NOTE — Progress Notes (Signed)
  Subjective:  Patient ID: Sydney Thompson, female    DOB: 1961/02/09,  MRN: [id number]  No chief complaint on file.  DOS: 05/01/21 Procedure: Excision of plantar fibroma left  60 y.o. female presents with the above complaint. History confirmed with patient. Doing well. Denies swelling. Occasional pain in great toe, but this was only for the past couple days  Objective:  Physical Exam: tenderness at the surgical site, local edema noted, and calf supple, nontender. Incision: healing well, no significant drainage, no dehiscence, no significant erythema  Assessment:   1. Post-operative state   2. Plantar fibromatosis    Plan:  Patient was evaluated and treated and all questions answered.  Post-operative State -Sutures removed -Ok to start showering at this time. Advised they cannot soak. -Dressing applied consisting of medihoney, sterile gauze, kerlix, and ACE bandage -WBAT in CAM boot -XRs needed at follow-up: none   Return in about 1 week (around 05/23/2021) for Post-Op (No XRs) Staple Removal.

## 2021-05-20 ENCOUNTER — Encounter: Payer: Medicare Other | Admitting: Podiatry

## 2021-05-20 DIAGNOSIS — R400 Somnolence: Secondary | ICD-10-CM | POA: Diagnosis not present

## 2021-05-20 DIAGNOSIS — G4733 Obstructive sleep apnea (adult) (pediatric): Secondary | ICD-10-CM | POA: Diagnosis not present

## 2021-05-23 ENCOUNTER — Ambulatory Visit (INDEPENDENT_AMBULATORY_CARE_PROVIDER_SITE_OTHER): Payer: Medicare Other | Admitting: Podiatry

## 2021-05-23 ENCOUNTER — Other Ambulatory Visit: Payer: Self-pay

## 2021-05-23 ENCOUNTER — Encounter: Payer: Self-pay | Admitting: Podiatry

## 2021-05-23 DIAGNOSIS — M722 Plantar fascial fibromatosis: Secondary | ICD-10-CM

## 2021-05-23 DIAGNOSIS — Z9889 Other specified postprocedural states: Secondary | ICD-10-CM

## 2021-05-23 NOTE — Progress Notes (Signed)
  Subjective:  Patient ID: Sydney Thompson, female    DOB: [birth year],  MRN: [id number]  Chief Complaint  Patient presents with   Routine Post Op    I am doing ok and I hope the staples come out today    DOS: 05/01/21 Procedure: Excision of plantar fibroma left  60 y.o. female presents with the above complaint. History confirmed with patient.   Objective:  Physical Exam: tenderness at the surgical site, local edema noted, and calf supple, nontender. Incision: healing well, no significant drainage, no dehiscence, no significant erythema  Assessment:   1. Post-operative state   2. Plantar fibromatosis     Plan:  Patient was evaluated and treated and all questions answered.  Post-operative State -Staples removed -Steri-strips applied to the incision -Ok to start showering at this time. Advised they cannot soak. -WBAT in Surgical shoe -Surgical shoe dispensed -XRs needed at follow-up: none   No follow-ups on file.

## 2021-06-06 ENCOUNTER — Other Ambulatory Visit: Payer: Self-pay

## 2021-06-06 ENCOUNTER — Ambulatory Visit (INDEPENDENT_AMBULATORY_CARE_PROVIDER_SITE_OTHER): Payer: Medicare Other | Admitting: Podiatry

## 2021-06-06 DIAGNOSIS — D219 Benign neoplasm of connective and other soft tissue, unspecified: Secondary | ICD-10-CM

## 2021-06-06 DIAGNOSIS — M722 Plantar fascial fibromatosis: Secondary | ICD-10-CM

## 2021-06-06 DIAGNOSIS — Z9889 Other specified postprocedural states: Secondary | ICD-10-CM

## 2021-06-06 NOTE — Progress Notes (Signed)
  Subjective:  Patient ID: Sydney Thompson, female    DOB: 1961/03/02,  MRN: [id number]  No chief complaint on file.  DOS: 05/01/21 Procedure: Excision of plantar fibroma left  60 y.o. female presents with the above complaint. History confirmed with patient. States the foot is doing fine she is having no pain while ambulating in her surgical shoe there is some.  Objective:  Physical Exam: tenderness at the surgical site, local edema noted, and calf supple, nontender. Incision: healing well with 0.3cm central superficial opening with mild ss drainage.  Assessment:   1. Plantar fibromatosis   2. Post-operative state   3. Fibroma      Plan:  Patient was evaluated and treated and all questions answered.  Post-operative State -Wound with small superficial open area. Dressed with silvadene and band-aid today. Continue abx ointment and band-aid daily. Ok to shower normally. Transition slowly to normal shoegear as tolerated. F/u in 1 month for recheck. -XRs needed at follow-up: none   No follow-ups on file.

## 2021-07-08 ENCOUNTER — Ambulatory Visit (INDEPENDENT_AMBULATORY_CARE_PROVIDER_SITE_OTHER): Payer: Medicare Other | Admitting: Podiatry

## 2021-07-08 ENCOUNTER — Other Ambulatory Visit: Payer: Self-pay

## 2021-07-08 DIAGNOSIS — Z9889 Other specified postprocedural states: Secondary | ICD-10-CM

## 2021-07-08 DIAGNOSIS — M722 Plantar fascial fibromatosis: Secondary | ICD-10-CM

## 2021-07-08 NOTE — Progress Notes (Signed)
  Subjective:  Patient ID: Sydney Thompson, female    DOB: Feb 22, 1961,  MRN: [id number]  Chief Complaint  Patient presents with   Routine Post Op    POV -pt deneis N/V/F/CH -pt states," doing good." - no issues just tender when exercise tx: none   DOS: 05/01/21 Procedure: Excision of plantar fibroma left  60 y.o. female presents with the above complaint. History confirmed with patient. States it is a little tender when she tries to stretch her arch but otherwise feels great without pain.  Objective:  Physical Exam: no tenderness at the surgical site, local edema noted, and calf supple, nontender. Incision: well healed.  Assessment:   1. Plantar fibromatosis   2. Post-operative state    Plan:  Patient was evaluated and treated and all questions answered.  Post-operative State -Well healed. Patient pleased with results. Denies other post-op issues. Not interested in surgical intervention for right foot fibroma at this time. Will d/c with f/u as needed.  No follow-ups on file.

## 2021-07-11 DIAGNOSIS — Z1231 Encounter for screening mammogram for malignant neoplasm of breast: Secondary | ICD-10-CM | POA: Diagnosis not present

## 2021-07-11 LAB — HM MAMMOGRAPHY

## 2021-07-12 DIAGNOSIS — L400 Psoriasis vulgaris: Secondary | ICD-10-CM | POA: Diagnosis not present

## 2021-08-08 DIAGNOSIS — L4050 Arthropathic psoriasis, unspecified: Secondary | ICD-10-CM | POA: Diagnosis not present

## 2021-08-08 DIAGNOSIS — L400 Psoriasis vulgaris: Secondary | ICD-10-CM | POA: Diagnosis not present

## 2021-08-15 ENCOUNTER — Ambulatory Visit: Payer: Medicare Other | Admitting: Family Medicine

## 2021-08-15 ENCOUNTER — Encounter: Payer: Self-pay | Admitting: *Deleted

## 2021-08-19 ENCOUNTER — Encounter: Payer: Self-pay | Admitting: Family Medicine

## 2021-08-19 ENCOUNTER — Ambulatory Visit (INDEPENDENT_AMBULATORY_CARE_PROVIDER_SITE_OTHER): Payer: Medicare Other | Admitting: Family Medicine

## 2021-08-19 ENCOUNTER — Other Ambulatory Visit: Payer: Self-pay

## 2021-08-19 VITALS — BP 110/70 | HR 77 | Temp 98.1°F | Resp 18 | Ht 62.0 in | Wt 235.2 lb

## 2021-08-19 DIAGNOSIS — E785 Hyperlipidemia, unspecified: Secondary | ICD-10-CM | POA: Diagnosis not present

## 2021-08-19 DIAGNOSIS — K648 Other hemorrhoids: Secondary | ICD-10-CM

## 2021-08-19 DIAGNOSIS — E559 Vitamin D deficiency, unspecified: Secondary | ICD-10-CM | POA: Diagnosis not present

## 2021-08-19 DIAGNOSIS — R42 Dizziness and giddiness: Secondary | ICD-10-CM | POA: Diagnosis not present

## 2021-08-19 LAB — VITAMIN B12: Vitamin B-12: 374 pg/mL (ref 211–911)

## 2021-08-19 LAB — CBC WITH DIFFERENTIAL/PLATELET
Basophils Absolute: 0 10*3/uL (ref 0.0–0.1)
Basophils Relative: 0.6 % (ref 0.0–3.0)
Eosinophils Absolute: 0.1 10*3/uL (ref 0.0–0.7)
Eosinophils Relative: 2.4 % (ref 0.0–5.0)
HCT: 36.3 % (ref 36.0–46.0)
Hemoglobin: 12 g/dL (ref 12.0–15.0)
Lymphocytes Relative: 24.1 % (ref 12.0–46.0)
Lymphs Abs: 1.3 10*3/uL (ref 0.7–4.0)
MCHC: 33 g/dL (ref 30.0–36.0)
MCV: 97.4 fl (ref 78.0–100.0)
Monocytes Absolute: 0.5 10*3/uL (ref 0.1–1.0)
Monocytes Relative: 9 % (ref 3.0–12.0)
Neutro Abs: 3.5 10*3/uL (ref 1.4–7.7)
Neutrophils Relative %: 63.9 % (ref 43.0–77.0)
Platelets: 173 10*3/uL (ref 150.0–400.0)
RBC: 3.72 Mil/uL — ABNORMAL LOW (ref 3.87–5.11)
RDW: 13.5 % (ref 11.5–15.5)
WBC: 5.4 10*3/uL (ref 4.0–10.5)

## 2021-08-19 LAB — LIPID PANEL
Cholesterol: 153 mg/dL (ref 0–200)
HDL: 37.6 mg/dL — ABNORMAL LOW (ref >39.00)
NonHDL: 115.44
Total CHOL/HDL Ratio: 4
Triglycerides: 220 mg/dL — ABNORMAL HIGH (ref 0.0–149.0)
VLDL: 44 mg/dL — ABNORMAL HIGH (ref 0.0–40.0)

## 2021-08-19 LAB — COMPREHENSIVE METABOLIC PANEL
ALT: 30 U/L (ref 0–35)
AST: 35 U/L (ref 0–37)
Albumin: 4.3 g/dL (ref 3.5–5.2)
Alkaline Phosphatase: 37 U/L — ABNORMAL LOW (ref 39–117)
BUN: 17 mg/dL (ref 6–23)
CO2: 25 mEq/L (ref 19–32)
Calcium: 9.8 mg/dL (ref 8.4–10.5)
Chloride: 105 mEq/L (ref 96–112)
Creatinine, Ser: 0.86 mg/dL (ref 0.40–1.20)
GFR: 72.73 mL/min (ref >60.00)
Glucose, Bld: 95 mg/dL (ref 70–99)
Potassium: 4.2 mEq/L (ref 3.5–5.1)
Sodium: 141 mEq/L (ref 135–145)
Total Bilirubin: 0.4 mg/dL (ref 0.2–1.2)
Total Protein: 7.1 g/dL (ref 6.0–8.3)

## 2021-08-19 LAB — TSH: TSH: 4.94 u[IU]/mL (ref 0.35–5.50)

## 2021-08-19 LAB — LDL CHOLESTEROL, DIRECT: Direct LDL: 93 mg/dL

## 2021-08-19 LAB — VITAMIN D 25 HYDROXY (VIT D DEFICIENCY, FRACTURES): VITD: 34.85 ng/mL (ref 30.00–100.00)

## 2021-08-19 MED ORDER — VITAMIN D (ERGOCALCIFEROL) 1.25 MG (50000 UNIT) PO CAPS: 50000.0000 [IU] | ORAL_CAPSULE | ORAL | 2 refills | Status: DC

## 2021-08-19 NOTE — Progress Notes (Signed)
Established Patient Office Visit  Subjective:  Patient ID: Sydney Thompson, female    DOB: October 27, 1961  Age: 60 y.o. MRN: [id number]  CC:  Chief Complaint  Patient presents with   Hyperlipidemia    Pt states fasting    Follow-up    HPI Sydney Thompson presents for f/u vita d and cholesterol.  She also states she is struggling with dizzy spells 1 every 2-3 days ------ it is not related to change in position and it lasts about 30-60 seconds.  She fell once and is not sure if she past out or not  She denies palpatations or chest pain.  No congestion or visual changes   Past Medical History:  Diagnosis Date   Depression    GERD (gastroesophageal reflux disease)    IBS (irritable bowel syndrome)     Past Surgical History:  Procedure Laterality Date   CHOLECYSTECTOMY     GALLBLADDER SURGERY     LITHOTRIPSY     Dr. Terance Hart    Family History  Problem Relation Age of Onset   Cancer Mother        breast   Alzheimer's disease Mother    Stroke Father    Heart disease Father        MI   Hyperlipidemia Father    Diabetes Father    Diabetes Paternal Uncle    Diabetes Paternal Uncle    Diabetes Paternal Uncle     Social History   Socioeconomic History   Marital status: Single    Spouse name: Not on file   Number of children: Not on file   Years of education: Not on file   Highest education level: Not on file  Occupational History   Occupation: [employer]- C[employer]  Tobacco Use   Smoking status: Never   Smokeless tobacco: Never  Substance and Sexual Activity   Alcohol use: No   Drug use: No   Sexual activity: Yes    Partners: Female    Birth control/protection: None  Other Topics Concern   Not on file  Social History Narrative   Exercise-- no   Social Determinants of Health   Financial Resource Strain: Not on file  Food Insecurity: Not on file  Transportation Needs: Not on file  Physical Activity: Not on file  Stress: Not on file  Social  Connections: Not on file  Intimate Partner Violence: Not on file    Outpatient Medications Prior to Visit  Medication Sig Dispense Refill   fenofibrate 160 MG tablet TAKE 1 TABLET BY MOUTH  DAILY 90 tablet 3   gabapentin (NEURONTIN) 300 MG capsule Take 1 capsule (300 mg total) by mouth at bedtime. 90 capsule 3   lovastatin (MEVACOR) 20 MG tablet TAKE 1 TABLET BY MOUTH AT  BEDTIME 90 tablet 3   Multiple Vitamin (MULTI-VITAMIN DAILY PO) Take by mouth.     Omega-3 Fatty Acids (FISH OIL) 1000 MG CAPS Take by mouth.     ondansetron (ZOFRAN) 4 MG tablet Take 1 tablet (4 mg total) by mouth every 8 (eight) hours as needed for nausea or vomiting. 20 tablet 0   oxyCODONE-acetaminophen (PERCOCET) 5-325 MG tablet Take 1 tablet by mouth every 4 (four) hours as needed for severe pain. 20 tablet 0   sertraline (ZOLOFT) 100 MG tablet TAKE 1 AND 1/2 TABLETS BY  MOUTH DAILY 135 tablet 3   TREMFYA 100 MG/ML SOSY Inject 1 Syringe into the skin every 8 (eight) weeks.  triamcinolone cream (KENALOG) 0.1 % Apply topically.     cephALEXin (KEFLEX) 500 MG capsule Take 1 tablet by mouth tonight, then one tomorrow AM and one in PM 3 capsule 0   Vitamin D, Ergocalciferol, (DRISDOL) 1.25 MG (50000 UNIT) CAPS capsule Take 1 capsule (50,000 Units total) by mouth every 7 (seven) days. 12 capsule 2   COVID-19 mRNA vaccine, Moderna, (MODERNA COVID-19 VACCINE) 100 MCG/0.5ML injection Inject into the muscle. (Patient not taking: Reported on 08/19/2021) 0.25 mL 0   No facility-administered medications prior to visit.    No Known Allergies  ROS Review of Systems  Constitutional:  Negative for appetite change, diaphoresis, fatigue, fever and unexpected weight change.  HENT:  Negative for congestion.   Eyes:  Negative for pain, redness and visual disturbance.  Respiratory:  Negative for cough, chest tightness, shortness of breath and wheezing.   Cardiovascular:  Negative for chest pain, palpitations and leg swelling.   Gastrointestinal:  Negative for vomiting.  Endocrine: Negative for cold intolerance, heat intolerance, polydipsia, polyphagia and polyuria.  Genitourinary:  Negative for difficulty urinating, dysuria and frequency.  Musculoskeletal:  Negative for back pain.  Skin:  Negative for rash.  Neurological:  Negative for dizziness, light-headedness, numbness and headaches.     Objective:    Physical Exam Vitals and nursing note reviewed.  Constitutional:      Appearance: She is well-developed.  HENT:     Head: Normocephalic and atraumatic.  Eyes:     Conjunctiva/sclera: Conjunctivae normal.  Neck:     Thyroid: No thyromegaly.     Vascular: No carotid bruit or JVD.  Cardiovascular:     Rate and Rhythm: Normal rate and regular rhythm.     Heart sounds: Normal heart sounds. No murmur heard. Pulmonary:     Effort: Pulmonary effort is normal. No respiratory distress.     Breath sounds: Normal breath sounds. No wheezing or rales.  Chest:     Chest wall: No tenderness.  Musculoskeletal:     Cervical back: Normal range of motion and neck supple.  Neurological:     Mental Status: She is alert and oriented to person, place, and time.  Psychiatric:        Mood and Affect: Mood normal.        Behavior: Behavior normal.        Thought Content: Thought content normal.        Judgment: Judgment normal.    BP 110/70 (BP Location: Left Arm, Patient Position: Sitting, Cuff Size: Large)   Pulse 77   Temp 98.1 F (36.7 C) (Oral)   Resp 18   Ht 5\' 2"  (1.575 m)   Wt 235 lb 3.2 oz (106.7 kg)   SpO2 98%   BMI 43.02 kg/m  Wt Readings from Last 3 Encounters:  08/19/21 235 lb 3.2 oz (106.7 kg)  02/12/21 210 lb 12.8 oz (95.6 kg)  08/10/20 233 lb 12.8 oz (106.1 kg)     Health Maintenance Due  Topic Date Due   Zoster Vaccines- Shingrix (1 of 2) Never done   PAP SMEAR-Modifier  11/17/2014   TETANUS/TDAP  02/21/2020   COVID-19 Vaccine (4 - Booster for Moderna series) 04/09/2021   INFLUENZA  VACCINE  05/27/2021   URINE MICROALBUMIN  08/10/2021    There are no preventive care reminders to display for this patient.  Lab Results  Component Value Date   TSH 2.87 08/10/2020   Lab Results  Component Value Date   WBC 5.3 02/12/2021  HGB 12.3 02/12/2021   HCT 36.2 02/12/2021   MCV 96.2 02/12/2021   PLT 177.0 02/12/2021   Lab Results  Component Value Date   NA 139 08/10/2020   K 4.2 08/10/2020   CO2 21 08/10/2020   GLUCOSE 94 08/10/2020   BUN 20 08/10/2020   CREATININE 0.81 08/10/2020   BILITOT 0.5 08/10/2020   ALKPHOS 43 05/07/2020   AST 30 08/10/2020   ALT 22 08/10/2020   PROT 7.4 08/10/2020   ALBUMIN 4.3 05/07/2020   CALCIUM 9.7 08/10/2020   GFR 72.00 05/07/2020   Lab Results  Component Value Date   CHOL 140 08/10/2020   Lab Results  Component Value Date   HDL 42 (L) 08/10/2020   Lab Results  Component Value Date   LDLCALC 76 08/10/2020   Lab Results  Component Value Date   TRIG 138 08/10/2020   Lab Results  Component Value Date   CHOLHDL 3.3 08/10/2020   Lab Results  Component Value Date   HGBA1C 5.9 08/16/2018      Assessment & Plan:   Problem List Items Addressed This Visit       Unprioritized   Dizziness - Primary    epley manuver ekg --- nsr--- no change from previous ekg Consider pt if no better       Relevant Orders   EKG 12-Lead (Completed)   CBC with Differential/Platelet   TSH   Vitamin B12   Hyperlipidemia    Encourage heart healthy diet such as MIND or DASH diet, increase exercise, avoid trans fats, simple carbohydrates and processed foods, consider a krill or fish or flaxseed oil cap daily.       Relevant Orders   Lipid panel   Comprehensive metabolic panel   Internal hemorrhoid   Vitamin D deficiency    Check labs       Relevant Medications   Vitamin D, Ergocalciferol, (DRISDOL) 1.25 MG (50000 UNIT) CAPS capsule   Other Relevant Orders   VITAMIN D 25 Hydroxy (Vit-D Deficiency, Fractures)    Meds  ordered this encounter  Medications   Vitamin D, Ergocalciferol, (DRISDOL) 1.25 MG (50000 UNIT) CAPS capsule    Sig: Take 1 capsule (50,000 Units total) by mouth every 7 (seven) days.    Dispense:  12 capsule    Refill:  2    Follow-up: Return in about 6 months (around 02/17/2022), or if symptoms worsen or fail to improve, for annual exam, fasting.    Ann Held, DO

## 2021-08-19 NOTE — Assessment & Plan Note (Signed)
Check labs 

## 2021-08-19 NOTE — Assessment & Plan Note (Signed)
Encourage heart healthy diet such as MIND or DASH diet, increase exercise, avoid trans fats, simple carbohydrates and processed foods, consider a krill or fish or flaxseed oil cap daily.  °

## 2021-08-19 NOTE — Assessment & Plan Note (Signed)
epley manuver ekg --- nsr--- no change from previous ekg Consider pt if no better

## 2021-08-19 NOTE — Patient Instructions (Signed)
How to Perform the Epley Maneuver The Epley maneuver is an exercise that relieves symptoms of vertigo. Vertigo is the feeling that you or your surroundings are moving when they are not. When you feel vertigo, you may feel like the room is spinning and may have trouble walking. The Epley maneuver is used for a type of vertigo caused by a calcium deposit in a part of the inner ear. The maneuver involves changing headpositions to help the deposit move out of the area. You can do this maneuver at home whenever you have symptoms of vertigo. You canrepeat it in 24 hours if your vertigo has not gone away. Even though the Epley maneuver may relieve your vertigo for a few weeks, it is possible that your symptoms will return. This maneuver relieves vertigo, but itdoes not relieve dizziness. What are the risks? If it is done correctly, the Epley maneuver is considered safe. Sometimes it can lead to dizziness or nausea that goes away after a short time. If you develop other symptoms--such as changes in vision, weakness, or numbness--stopdoing the maneuver and call your health care provider. Supplies needed: A bed or table. A pillow. How to do the Epley maneuver     Sit on the edge of a bed or table with your back straight and your legs extended or hanging over the edge of the bed or table. Turn your head halfway toward the affected ear or side as told by your health care provider. Lie backward quickly with your head turned until you are lying flat on your back. Your head should dangle (head-hanging position). You may want to position a pillow under your shoulders. Hold this position for at least 30 seconds. If you feel dizzy or have symptoms of vertigo, continue to hold the position until the symptoms stop. Turn your head to the opposite direction until your unaffected ear is facing down. Your head should continue to dangle. Hold this position for at least 30 seconds. If you feel dizzy or have symptoms of  vertigo, continue to hold the position until the symptoms stop. Turn your whole body to the same side as your head so that you are positioned on your side. Your head will now be nearly facedown and no longer needs to dangle. Hold for at least 30 seconds. If you feel dizzy or have symptoms of vertigo, continue to hold the position until the symptoms stop. Sit back up. You can repeat the maneuver in 24 hours if your vertigo does not go away. Follow these instructions at home: For 24 hours after doing the Epley maneuver: Keep your head in an upright position. When lying down to sleep or rest, keep your head raised (elevated) with two or more pillows. Avoid excessive neck movements. Activity Do not drive or use machinery if you feel dizzy. After doing the Epley maneuver, return to your normal activities as told by your health care provider. Ask your health care provider what activities are safe for you. General instructions Drink enough fluid to keep your urine pale yellow. Do not drink alcohol. Take over-the-counter and prescription medicines only as told by your health care provider. Keep all follow-up visits. This is important. Preventing vertigo symptoms Ask your health care provider if there is anything you should do at home to prevent vertigo. He or she may recommend that you: Keep your head elevated with two or more pillows while you sleep. Do not sleep on the side of your affected ear. Get up slowly from bed.   Avoid sudden movements during the day. Avoid extreme head positions or movement, such as looking up or bending over. Contact a health care provider if: Your vertigo gets worse. You have other symptoms, including: Nausea. Vomiting. Headache. Get help right away if you: Have vision changes. Have a headache or neck pain that is severe or getting worse. Cannot stop vomiting. Have new numbness or weakness in any part of your body. These symptoms may represent a serious problem  that is an emergency. Do not wait to see if the symptoms will go away. Get medical help right away. Call your local emergency services (911 in the [place] Do not drive yourself to the hospital. Summary Vertigo is the feeling that you or your surroundings are moving when they are not. The Epley maneuver is an exercise that relieves symptoms of vertigo. If the Epley maneuver is done correctly, it is considered safe. This information is not intended to replace advice given to you by your health care provider. Make sure you discuss any questions you have with your healthcare provider. Document Revised: 09/12/2020 Document Reviewed: 09/12/2020 Elsevier Patient Education  2022 Elsevier Inc.  

## 2021-08-21 ENCOUNTER — Other Ambulatory Visit: Payer: Self-pay | Admitting: Family Medicine

## 2021-08-21 DIAGNOSIS — Z23 Encounter for immunization: Secondary | ICD-10-CM | POA: Diagnosis not present

## 2021-08-21 DIAGNOSIS — G4733 Obstructive sleep apnea (adult) (pediatric): Secondary | ICD-10-CM | POA: Diagnosis not present

## 2021-08-21 DIAGNOSIS — E785 Hyperlipidemia, unspecified: Secondary | ICD-10-CM

## 2021-08-27 ENCOUNTER — Telehealth: Payer: Self-pay | Admitting: Family Medicine

## 2021-08-27 NOTE — Telephone Encounter (Signed)
Pt. Would like a letter of her lab results sent to her house.

## 2021-08-28 NOTE — Telephone Encounter (Signed)
Letter placed in mail.

## 2021-08-29 DIAGNOSIS — G4733 Obstructive sleep apnea (adult) (pediatric): Secondary | ICD-10-CM | POA: Diagnosis not present

## 2021-08-29 DIAGNOSIS — R400 Somnolence: Secondary | ICD-10-CM | POA: Diagnosis not present

## 2021-08-29 DIAGNOSIS — G4761 Periodic limb movement disorder: Secondary | ICD-10-CM | POA: Diagnosis not present

## 2021-09-06 DIAGNOSIS — L400 Psoriasis vulgaris: Secondary | ICD-10-CM | POA: Diagnosis not present

## 2021-09-18 ENCOUNTER — Telehealth: Payer: Self-pay | Admitting: Family Medicine

## 2021-09-18 NOTE — Telephone Encounter (Signed)
Left message for patient to call back and schedule Medicare Annual Wellness Visit (AWV) in office.  ° °If not able to come in office, please offer to do virtually or by telephone.  Left office number and my jabber #[phone]. ° °Due for AWVI ° °Please schedule at anytime with Nurse Health Advisor. °  °

## 2021-10-31 ENCOUNTER — Other Ambulatory Visit: Payer: Self-pay | Admitting: Family Medicine

## 2021-10-31 DIAGNOSIS — M79641 Pain in right hand: Secondary | ICD-10-CM

## 2021-11-07 DIAGNOSIS — G4733 Obstructive sleep apnea (adult) (pediatric): Secondary | ICD-10-CM | POA: Diagnosis not present

## 2021-11-07 DIAGNOSIS — R400 Somnolence: Secondary | ICD-10-CM | POA: Diagnosis not present

## 2021-11-07 DIAGNOSIS — G4761 Periodic limb movement disorder: Secondary | ICD-10-CM | POA: Diagnosis not present

## 2021-11-15 DIAGNOSIS — L400 Psoriasis vulgaris: Secondary | ICD-10-CM | POA: Diagnosis not present

## 2021-11-29 ENCOUNTER — Telehealth: Payer: Self-pay | Admitting: Family Medicine

## 2021-11-29 NOTE — Telephone Encounter (Signed)
Left message for patient to call back and schedule Medicare Annual Wellness Visit (AWV) in office.  ° °If not able to come in office, please offer to do virtually or by telephone.  Left office number and my jabber #[phone]. ° °Due for AWVI ° °Please schedule at anytime with Nurse Health Advisor. °  °

## 2022-01-09 DIAGNOSIS — G4733 Obstructive sleep apnea (adult) (pediatric): Secondary | ICD-10-CM | POA: Diagnosis not present

## 2022-01-09 DIAGNOSIS — R400 Somnolence: Secondary | ICD-10-CM | POA: Diagnosis not present

## 2022-01-09 DIAGNOSIS — G4761 Periodic limb movement disorder: Secondary | ICD-10-CM | POA: Diagnosis not present

## 2022-01-14 ENCOUNTER — Ambulatory Visit (INDEPENDENT_AMBULATORY_CARE_PROVIDER_SITE_OTHER): Payer: Medicare Other

## 2022-01-14 VITALS — Wt 235.0 lb

## 2022-01-14 DIAGNOSIS — Z5982 Transportation insecurity: Secondary | ICD-10-CM

## 2022-01-14 DIAGNOSIS — Z0000 Encounter for general adult medical examination without abnormal findings: Secondary | ICD-10-CM

## 2022-01-14 NOTE — Progress Notes (Signed)
? ?Subjective:  ? Sydney Thompson is a 61 y.o. female who presents for an Initial Medicare Annual Wellness Visit. ? ?Virtual Visit via Telephone Note ? ?I connected with  [name] on 01/14/22 at  2:00 PM EDT by telephone and verified that I am speaking with the correct person using two identifiers. ? ?Location: ?Patient: Home ?Provider: [facility] ?Persons participating in the virtual visit: patient/Nurse Health Advisor ?  ?I discussed the limitations, risks, security and privacy concerns of performing an evaluation and management service by telephone and the availability of in person appointments. The patient expressed understanding and agreed to proceed. ? ?Interactive audio and video telecommunications were attempted between this nurse and patient, however failed, due to patient having technical difficulties OR patient did not have access to video capability.  We continued and completed visit with audio only. ? ?Some vital signs may be absent or patient reported.  ? ?[name], LPN  ? ?Review of Systems    ? ?Cardiac Risk Factors include: advanced age (>39mn, >>26women);dyslipidemia;sedentary lifestyle;obesity (BMI >30kg/m2);Other (see comment), Risk factor comments: OSA on CPAP ? ?   ?Objective:  ?  ?Today's Vitals  ? 01/14/22 1345 01/14/22 1346  ?Weight: 235 lb (106.6 kg)   ?PainSc:  2   ? ?Body mass index is 42.98 kg/m?. ? ?Advanced Directives 01/14/2022  ?Does Patient Have a Medical Advance Directive? No  ?Would patient like information on creating a medical advance directive? No - Patient declined  ? ? ?Current Medications (verified) ?Outpatient Encounter Medications as of 01/14/2022  ?Medication Sig  ? fenofibrate 160 MG tablet TAKE 1 TABLET BY MOUTH  DAILY  ? gabapentin (NEURONTIN) 300 MG capsule TAKE 1 CAPSULE BY MOUTH AT  BEDTIME  ? lovastatin (MEVACOR) 20 MG tablet TAKE 1 TABLET BY MOUTH AT  BEDTIME  ? Multiple Vitamin (MULTI-VITAMIN DAILY PO) Take by mouth.  ? Omega-3 Fatty Acids  (FISH OIL) 1000 MG CAPS Take by mouth.  ? sertraline (ZOLOFT) 100 MG tablet TAKE 1 AND 1/2 TABLETS BY  MOUTH DAILY  ? TREMFYA 100 MG/ML SOSY Inject 1 Syringe into the skin every 8 (eight) weeks.  ? triamcinolone cream (KENALOG) 0.1 % Apply topically.  ? ondansetron (ZOFRAN) 4 MG tablet Take 1 tablet (4 mg total) by mouth every 8 (eight) hours as needed for nausea or vomiting. (Patient not taking: Reported on 01/14/2022)  ? oxyCODONE-acetaminophen (PERCOCET) 5-325 MG tablet Take 1 tablet by mouth every 4 (four) hours as needed for severe pain. (Patient not taking: Reported on 01/14/2022)  ? Vitamin D, Ergocalciferol, (DRISDOL) 1.25 MG (50000 UNIT) CAPS capsule Take 1 capsule (50,000 Units total) by mouth every 7 (seven) days. (Patient not taking: Reported on 01/14/2022)  ? ?No facility-administered encounter medications on file as of 01/14/2022.  ? ? ?Allergies (verified) ?Patient has no known allergies.  ? ?History: ?Past Medical History:  ?Diagnosis Date  ? Depression   ? GERD (gastroesophageal reflux disease)   ? IBS (irritable bowel syndrome)   ? ?Past Surgical History:  ?Procedure Laterality Date  ? CHOLECYSTECTOMY    ? GALLBLADDER SURGERY    ? LITHOTRIPSY    ? Dr. PTerance Hart ? ?Family History  ?Problem Relation Age of Onset  ? Cancer Mother   ?     breast  ? Alzheimer's disease Mother   ? Stroke Father   ? Heart disease Father   ?     MI  ? Hyperlipidemia Father   ? Diabetes Father   ?  Diabetes Paternal Uncle   ? Diabetes Paternal Uncle   ? Diabetes Paternal Uncle   ? ?Social History  ? ?Socioeconomic History  ? Marital status: Soil scientist  ?  Spouse name: Not on file  ? Number of children: Not on file  ? Years of education: Not on file  ? Highest education level: Not on file  ?Occupational History  ? Occupation: [employer] [place]  ?Tobacco Use  ? Smoking status: Never  ? Smokeless tobacco: Never  ?Substance and Sexual Activity  ? Alcohol use: No  ? Drug use: No  ? Sexual activity: Yes  ?   Partners: Female  ?  Birth control/protection: None  ?Other Topics Concern  ? Not on file  ?Social History Narrative  ? Exercise-- no  ? ?Social Determinants of Health  ? ?Financial Resource Strain: Low Risk   ? Difficulty of Paying Living Expenses: Not hard at all  ?Food Insecurity: No Food Insecurity  ? Worried About Charity fundraiser in the Last Year: Never true  ? Ran Out of Food in the Last Year: Never true  ?Transportation Needs: Unmet Transportation Needs  ? Lack of Transportation (Medical): Yes  ? Lack of Transportation (Non-Medical): Yes  ?Physical Activity: Unknown  ? Days of Exercise per Week: 0 days  ? Minutes of Exercise per Session: Not on file  ?Stress: No Stress Concern Present  ? Feeling of Stress : Only a little  ?Social Connections: Moderately Integrated  ? Frequency of Communication with Friends and Family: More than three times a week  ? Frequency of Social Gatherings with Friends and Family: More than three times a week  ? Attends Religious Services: Never  ? Active Member of Clubs or Organizations: Yes  ? Attends Archivist Meetings: 1 to 4 times per year  ? Marital Status: Living with partner  ? ? ?Tobacco Counseling ?Counseling given: Not Answered ? ? ?Clinical Intake: ? ?Pre-visit preparation completed: Yes ? ?Pain : 0-10 ?Pain Score: 2  ?Pain Type: Chronic pain ?Pain Location: Generalized ?Pain Descriptors / Indicators: Aching, Discomfort ?Pain Onset: More than a month ago ?Pain Frequency: Intermittent ? ?  ? ?BMI - recorded: 42.98 ?Nutritional Status: BMI > 30  Obese ?Nutritional Risks: None ?Diabetes: No ? ?How often do you need to have someone help you when you read instructions, pamphlets, or other written materials from your doctor or pharmacy?: 1 - Never ? ?Diabetic? no ? ?Interpreter Needed?: No ? ?Information entered by :: Debara Kamphuis, LPN ? ? ?Activities of Daily Living ?In your present state of health, do you have any difficulty performing the following activities:  01/14/2022  ?Hearing? N  ?Vision? N  ?Difficulty concentrating or making decisions? N  ?Walking or climbing stairs? Y  ?Dressing or bathing? N  ?Doing errands, shopping? Y  ?Preparing Food and eating ? N  ?Using the Toilet? N  ?In the past six months, have you accidently leaked urine? N  ?Do you have problems with loss of bowel control? N  ?Managing your Medications? N  ?Managing your Finances? N  ?Housekeeping or managing your Housekeeping? N  ?Some recent data might be hidden  ? ? ?Patient Care Team: ?Carollee Herter, Alferd Apa, DO as PCP - General ?Dunzweiler, Vertell Limber, RN as Registered Nurse (Dermatology) ?Gardiner Rhyme, MD as Referring Physician (Sleep Medicine) ?Evelina Bucy, DPM as Consulting Physician (Podiatry) ?Jolene Schimke, MD as Referring Physician (Dermatology) ? ?Indicate any recent Medical Services you may have  received from other than [facility] providers in the past year (date may be approximate). ? ?   ?Assessment:  ? This is a routine wellness examination for Toiya. ? ?Hearing/Vision screen ?Hearing Screening - Comments:: Denies hearing difficulties   ?Vision Screening - Comments:: Wears rx glasses - behind on annual eye exams - no regular optometrist at this time ? ?Dietary issues and exercise activities discussed: ?Current Exercise Habits: The patient does not participate in regular exercise at present, Exercise limited by: orthopedic condition(s) ? ? Goals Addressed   ? ?  ?  ?  ?  ? This Visit's Progress  ?  Patient Stated     ?  01/14/2022 - hopes to find transportation assistance for her and her partner. ?(CRR sent to help - A.Renard Caperton, LPN) ?  ? ?  ? ?Depression Screen ?PHQ 2/9 Scores 01/14/2022 08/19/2021 02/12/2021 05/07/2020 08/16/2018 01/19/2017  ?PHQ - 2 Score 1 1 0 0 0 0  ?PHQ- 9 Score - - 0 - 3 -  ?  ?Fall Risk ?Fall Risk  01/14/2022 08/19/2021  ?Falls in the past year? 1 0  ?Number falls in past yr: 0 0  ?Injury with Fall? 0 0  ?Risk for fall due to : History of fall(s);Orthopedic  patient Impaired balance/gait  ?Follow up Education provided;Falls prevention discussed -  ? ? ?FALL RISK PREVENTION PERTAINING TO THE HOME: ? ?Any stairs in or around the home? No  ?If so, are there any without handr

## 2022-01-14 NOTE — Patient Instructions (Signed)
Sydney Thompson , ?Thank you for taking time to come for your Medicare Wellness Visit. I appreciate your ongoing commitment to your health goals. Please review the following plan we discussed and let me know if I can assist you in the future.  ? ?Screening recommendations/referrals: ?Colonoscopy: Done 12/26/2020 - repeat in 7-10 years ?Mammogram: Done 07/11/2021 - Repeat annually ?Bone Density: Due at age 61 ?Recommended yearly ophthalmology/optometry visit for glaucoma screening and checkup ?Recommended yearly dental visit for hygiene and checkup ? ?Vaccinations: ?Influenza vaccine: Done 2022 - Repeat annually ?Pneumococcal vaccine: Due - ask about once per lifetime [id number] ?Tdap vaccine: Done 02/20/2010 - Repeat in 10 years *due ?Shingles vaccine: Due - Shingrix is 2 doses 2-6 months apart and over 90% effective    ?Covid-19: Done 02/02/20, 03/01/20, 09/25/20, & 02/12/21 ? ?Advanced directives: Advance directive discussed with you today. Even though you declined this today, please call our office should you change your mind, and we can give you the proper paperwork for you to fill out.  ? ?Conditions/risks identified: Aim for 30 minutes of exercise or brisk walking, 6-8 glasses of water, and 5 servings of fruits and vegetables each day.  ? ?Next appointment: Follow up in one year for your annual wellness visit.  ? ?Preventive Care 40-64 Years, Female ?Preventive care refers to lifestyle choices and visits with your health care provider that can promote health and wellness. ?What does preventive care include? ?A yearly physical exam. This is also called an annual well check. ?Dental exams once or twice a year. ?Routine eye exams. Ask your health care provider how often you should have your eyes checked. ?Personal lifestyle choices, including: ?Daily care of your teeth and gums. ?Regular physical activity. ?Eating a healthy diet. ?Avoiding tobacco and drug use. ?Limiting alcohol use. ?Practicing safe sex. ?Taking low-dose  aspirin daily starting at age 58. ?Taking vitamin and mineral supplements as recommended by your health care provider. ?What happens during an annual well check? ?The services and screenings done by your health care provider during your annual well check will depend on your age, overall health, lifestyle risk factors, and family history of disease. ?Counseling  ?Your health care provider may ask you questions about your: ?Alcohol use. ?Tobacco use. ?Drug use. ?Emotional well-being. ?Home and relationship well-being. ?Sexual activity. ?Eating habits. ?Work and work Statistician. ?Method of birth control. ?Menstrual cycle. ?Pregnancy history. ?Screening  ?You may have the following tests or measurements: ?Height, weight, and BMI. ?Blood pressure. ?Lipid and cholesterol levels. These may be checked every 5 years, or more frequently if you are over 56 years old. ?Skin check. ?Lung cancer screening. You may have this screening every year starting at age 11 if you have a 30-pack-year history of smoking and currently smoke or have quit within the past 15 years. ?Fecal occult blood test (FOBT) of the stool. You may have this test every year starting at age 1. ?Flexible sigmoidoscopy or colonoscopy. You may have a sigmoidoscopy every 5 years or a colonoscopy every 10 years starting at age 1. ?Hepatitis C blood test. ?Hepatitis B blood test. ?Sexually transmitted disease (STD) testing. ?Diabetes screening. This is done by checking your blood sugar (glucose) after you have not eaten for a while (fasting). You may have this done every 1-3 years. ?Mammogram. This may be done every 1-2 years. Talk to your health care provider about when you should start having regular mammograms. This may depend on whether you have a family history of breast cancer. ?BRCA-related cancer screening.  This may be done if you have a family history of breast, ovarian, tubal, or peritoneal cancers. ?Pelvic exam and Pap test. This may be done every 3  years starting at age 80. Starting at age 73, this may be done every 5 years if you have a Pap test in combination with an HPV test. ?Bone density scan. This is done to screen for osteoporosis. You may have this scan if you are at high risk for osteoporosis. ?Discuss your test results, treatment options, and if necessary, the need for more tests with your health care provider. ?Vaccines  ?Your health care provider may recommend certain vaccines, such as: ?Influenza vaccine. This is recommended every year. ?Tetanus, diphtheria, and acellular pertussis (Tdap, Td) vaccine. You may need a Td booster every 10 years. ?Zoster vaccine. You may need this after age 8. ?Pneumococcal 13-valent conjugate (PCV13) vaccine. You may need this if you have certain conditions and were not previously vaccinated. ?Pneumococcal polysaccharide (PPSV23) vaccine. You may need one or two doses if you smoke cigarettes or if you have certain conditions. ?Talk to your health care provider about which screenings and vaccines you need and how often you need them. ?This information is not intended to replace advice given to you by your health care provider. Make sure you discuss any questions you have with your health care provider. ?Document Released: 11/09/2015 Document Revised: 07/02/2016 Document Reviewed: 08/14/2015 ?Elsevier Interactive Patient Education ? 2017 Elsevier Inc. ? ? ? ?Fall Prevention in the Home ?Falls can cause injuries. They can happen to people of all ages. There are many things you can do to make your home safe and to help prevent falls. ?What can I do on the outside of my home? ?Regularly fix the edges of walkways and driveways and fix any cracks. ?Remove anything that might make you trip as you walk through a door, such as a raised step or threshold. ?Trim any bushes or trees on the path to your home. ?Use bright outdoor lighting. ?Clear any walking paths of anything that might make someone trip, such as rocks or  tools. ?Regularly check to see if handrails are loose or broken. Make sure that both sides of any steps have handrails. ?Any raised decks and porches should have guardrails on the edges. ?Have any leaves, snow, or ice cleared regularly. ?Use sand or salt on walking paths during winter. ?Clean up any spills in your garage right away. This includes oil or grease spills. ?What can I do in the bathroom? ?Use night lights. ?Install grab bars by the toilet and in the tub and shower. Do not use towel bars as grab bars. ?Use non-skid mats or decals in the tub or shower. ?If you need to sit down in the shower, use a plastic, non-slip stool. ?Keep the floor dry. Clean up any water that spills on the floor as soon as it happens. ?Remove soap buildup in the tub or shower regularly. ?Attach bath mats securely with double-sided non-slip rug tape. ?Do not have throw rugs and other things on the floor that can make you trip. ?What can I do in the bedroom? ?Use night lights. ?Make sure that you have a light by your bed that is easy to reach. ?Do not use any sheets or blankets that are too big for your bed. They should not hang down onto the floor. ?Have a firm chair that has side arms. You can use this for support while you get dressed. ?Do not have throw rugs and  other things on the floor that can make you trip. ?What can I do in the kitchen? ?Clean up any spills right away. ?Avoid walking on wet floors. ?Keep items that you use a lot in easy-to-reach places. ?If you need to reach something above you, use a strong step stool that has a grab bar. ?Keep electrical cords out of the way. ?Do not use floor polish or wax that makes floors slippery. If you must use wax, use non-skid floor wax. ?Do not have throw rugs and other things on the floor that can make you trip. ?What can I do with my stairs? ?Do not leave any items on the stairs. ?Make sure that there are handrails on both sides of the stairs and use them. Fix handrails that are  broken or loose. Make sure that handrails are as long as the stairways. ?Check any carpeting to make sure that it is firmly attached to the stairs. Fix any carpet that is loose or worn. ?Avoid having throw rugs at

## 2022-01-15 ENCOUNTER — Telehealth: Payer: Self-pay

## 2022-01-15 NOTE — Telephone Encounter (Signed)
? ?  Telephone encounter was:  Unsuccessful.  01/15/2022 ?Name: Sydney Thompson MRN: [id number] DOB: August 07, 1961 ? ?Unsuccessful outbound call made today to assist with:  Transportation Needs  ? ?Outreach Attempt:  1st Attempt ? ?A HIPAA compliant voice message was left requesting a return call.  Instructed patient to call back at [employer]. ?. ? ? ? ?Larena Sox ?Care Guide, Embedded Care Coordination ?[place], Care Management  ?[phone] ?[address], [place], [place] [address] ?Phone: [phone] ?Email: [email or web address] ? ?  ?

## 2022-01-15 NOTE — Telephone Encounter (Signed)
Please disregard was in wrong context  ?

## 2022-01-16 ENCOUNTER — Telehealth: Payer: Self-pay

## 2022-01-16 NOTE — Telephone Encounter (Signed)
? ?  Telephone encounter was:  Unsuccessful.  01/16/2022 ?Name: Sydney Thompson MRN: [id number] DOB: 10-Oct-1961 ? ?Unsuccessful outbound call made today to assist with:  Transportation Needs  ? ?Outreach Attempt:  2nd Attempt ? ?A HIPAA compliant voice message was left requesting a return call.  Instructed patient to call back at earliest convenience. ? ?. ? ? ?Larena Sox ?Care Guide, Embedded Care Coordination ?[place], Care Management  ?[phone] ?[address], [place], [place] [address] ?Phone: [phone] ?Email: [email or web address] ? ?  ?

## 2022-01-20 ENCOUNTER — Telehealth: Payer: Self-pay

## 2022-01-20 NOTE — Telephone Encounter (Signed)
? ?  Telephone encounter was:  Unsuccessful.  01/20/2022 ?Name: Sydney Thompson MRN: [id number] DOB: [birth year] ? ?Unsuccessful outbound call made today to assist with:  Transportation Needs  ? ?Outreach Attempt:  3rd Attempt.  Referral closed unable to contact patient. ? ?A HIPAA compliant voice message was left requesting a return call.  Instructed patient to call back at earliest convenience. ?. ? ? ? ?Larena Sox ?Care Guide, Embedded Care Coordination ?[place], Care Management  ?[phone] ?[address], [place], [place] [address] ?Phone: [phone] ?Email: [email or web address] ? ?  ?

## 2022-01-20 NOTE — Telephone Encounter (Signed)
? ?  Telephone encounter was:  Successful.  ?01/20/2022 ?Name: Sydney Thompson MRN: [id number] DOB: 09-27-1961 ? ?Sydney Thompson is a 61 y.o. year old female who is a primary care patient of Ann Held, DO . The community resource team was consulted for assistance with Transportation Needs  ? ?Care guide performed the following interventions: Patient provided with information about care guide support team and interviewed to confirm resource needs.Patient stated she has no transportation needs right now but I have her resources over the phone for later ? ?Follow Up Plan:  No further follow up planned at this time. The patient has been provided with needed resources. ? ? ? ?Larena Sox ?Care Guide, Embedded Care Coordination ?[place], Care Management  ?[phone] ?[address], [place], [place] [address] ?Phone: [phone] ?Email: [email or web address] ? ?  ?

## 2022-01-28 DIAGNOSIS — G4733 Obstructive sleep apnea (adult) (pediatric): Secondary | ICD-10-CM | POA: Diagnosis not present

## 2022-02-06 DIAGNOSIS — G4733 Obstructive sleep apnea (adult) (pediatric): Secondary | ICD-10-CM | POA: Diagnosis not present

## 2022-02-06 DIAGNOSIS — L400 Psoriasis vulgaris: Secondary | ICD-10-CM | POA: Diagnosis not present

## 2022-02-06 DIAGNOSIS — G4761 Periodic limb movement disorder: Secondary | ICD-10-CM | POA: Diagnosis not present

## 2022-02-06 DIAGNOSIS — R531 Weakness: Secondary | ICD-10-CM | POA: Diagnosis not present

## 2022-02-13 DIAGNOSIS — L400 Psoriasis vulgaris: Secondary | ICD-10-CM | POA: Diagnosis not present

## 2022-02-18 ENCOUNTER — Encounter: Payer: Self-pay | Admitting: Family Medicine

## 2022-02-18 ENCOUNTER — Ambulatory Visit (INDEPENDENT_AMBULATORY_CARE_PROVIDER_SITE_OTHER): Payer: Medicare Other | Admitting: Family Medicine

## 2022-02-18 VITALS — BP 110/70 | HR 80 | Temp 98.4°F | Resp 18 | Ht 62.0 in | Wt 239.8 lb

## 2022-02-18 DIAGNOSIS — E785 Hyperlipidemia, unspecified: Secondary | ICD-10-CM | POA: Diagnosis not present

## 2022-02-18 DIAGNOSIS — G4733 Obstructive sleep apnea (adult) (pediatric): Secondary | ICD-10-CM | POA: Diagnosis not present

## 2022-02-18 DIAGNOSIS — Z0000 Encounter for general adult medical examination without abnormal findings: Secondary | ICD-10-CM

## 2022-02-18 DIAGNOSIS — E559 Vitamin D deficiency, unspecified: Secondary | ICD-10-CM | POA: Diagnosis not present

## 2022-02-18 LAB — MICROALBUMIN / CREATININE URINE RATIO
Creatinine,U: 189.4 mg/dL
Microalb Creat Ratio: 9.8 mg/g (ref 0.0–30.0)
Microalb, Ur: 18.5 mg/dL — ABNORMAL HIGH (ref 0.0–1.9)

## 2022-02-18 MED ORDER — VITAMIN D (ERGOCALCIFEROL) 1.25 MG (50000 UNIT) PO CAPS: 50000.0000 [IU] | ORAL_CAPSULE | ORAL | 2 refills | Status: DC

## 2022-02-18 NOTE — Assessment & Plan Note (Signed)
Check labs today.

## 2022-02-18 NOTE — Assessment & Plan Note (Signed)
ghm utd Check labs  

## 2022-02-18 NOTE — Assessment & Plan Note (Signed)
Encourage heart healthy diet such as MIND or DASH diet, increase exercise, avoid trans fats, simple carbohydrates and processed foods, consider a krill or fish or flaxseed oil cap daily.  °

## 2022-02-18 NOTE — Assessment & Plan Note (Signed)
Per pulmonary 

## 2022-02-18 NOTE — Patient Instructions (Signed)

## 2022-02-18 NOTE — Progress Notes (Signed)
? ?Subjective:  ? ?By signing my name below, I, Shehryar Baig, attest that this documentation has been prepared under the direction and in the presence of Ann Held, DO  02/18/2022 ?  ? ? Patient ID: Sydney Thompson, female    DOB: 07-11-1961, 61 y.o.   MRN: [id number] ? ?Chief Complaint  ?Patient presents with  ? Annual Exam  ?  Pt states fasting   ? ? ?HPI ?Patient is in today for a comprehensive physical exam.  ? ?She continues seeing her pulmonologist and sleep specialist regularly. She continues wearing a CPAP machine while sleeping.  ?She continues seeing her dermatologist regularly.  ?She was taking 1.25 mg vitamin D supplements every 7 days and reports no new issues while taking it. She has ran out and has not taken any this week.  ?She continues taking 100 mg/ml Tremfya injections and reports no new issues while taking it.  ? ?She denies having any fever, ear pain, muscle pain, new joint pain, new moles, congestion, sinus pain, sore throat, eye pain, chest pain, palpations, cough, SOB, wheezing, n/v/d, constipation, blood in stool, dysuria, frequency, hematuria, or headaches at this time. ?She has no change in family medical history. She has no recent surgical procedures.  ?She has 4 moderna Covid-19 vaccines. She has not received the bivalent Covid-19 vaccine. She is eligible for the shingrix vaccine and is interested in receiving it at her pharmacy. She is due for a tetanus vaccine and is interested in receiving it at her pharmacy.  ?She does not participate in regular exercise due to pain. She has not tried swimming for regular exercise.  ?She does not receive pap smears any more.  ?She is due for vision care. She is planning on finding a provider and establishing care.  ?She is UTD on dental care.  ? ? ?Past Medical History:  ?Diagnosis Date  ? Depression   ? GERD (gastroesophageal reflux disease)   ? IBS (irritable bowel syndrome)   ? ? ?Past Surgical History:  ?Procedure Laterality Date  ?  CHOLECYSTECTOMY    ? GALLBLADDER SURGERY    ? LITHOTRIPSY    ? Dr. Terance Hart  ? ? ?Family History  ?Problem Relation Age of Onset  ? Cancer Mother   ?     breast  ? Alzheimer's disease Mother   ? Stroke Father   ? Heart disease Father   ?     MI  ? Hyperlipidemia Father   ? Diabetes Father   ? Diabetes Paternal Uncle   ? Diabetes Paternal Uncle   ? Diabetes Paternal Uncle   ? ? ?Social History  ? ?Socioeconomic History  ? Marital status: Soil scientist  ?  Spouse name: Not on file  ? Number of children: Not on file  ? Years of education: Not on file  ? Highest education level: Not on file  ?Occupational History  ? Occupation: [employer]  ?Tobacco Use  ? Smoking status: Never  ? Smokeless tobacco: Never  ?Substance and Sexual Activity  ? Alcohol use: No  ? Drug use: No  ? Sexual activity: Yes  ?  Partners: Female  ?  Birth control/protection: None  ?Other Topics Concern  ? Not on file  ?Social History Narrative  ? Exercise-- no  ? ?Social Determinants of Health  ? ?Financial Resource Strain: Low Risk   ? Difficulty of Paying Living Expenses: Not hard at all  ?Food Insecurity: No Food Insecurity  ? Worried About  Running Out of Food in the Last Year: Never true  ? Ran Out of Food in the Last Year: Never true  ?Transportation Needs: Unmet Transportation Needs  ? Lack of Transportation (Medical): Yes  ? Lack of Transportation (Non-Medical): Yes  ?Physical Activity: Unknown  ? Days of Exercise per Week: 0 days  ? Minutes of Exercise per Session: Not on file  ?Stress: No Stress Concern Present  ? Feeling of Stress : Only a little  ?Social Connections: Moderately Integrated  ? Frequency of Communication with Friends and Family: More than three times a week  ? Frequency of Social Gatherings with Friends and Family: More than three times a week  ? Attends Religious Services: Never  ? Active Member of Clubs or Organizations: Yes  ? Attends Archivist Meetings: 1 to 4 times per year  ? Marital  Status: Living with partner  ?Intimate Partner Violence: Not At Risk  ? Fear of Current or Ex-Partner: No  ? Emotionally Abused: No  ? Physically Abused: No  ? Sexually Abused: No  ? ? ?Outpatient Medications Prior to Visit  ?Medication Sig Dispense Refill  ? fenofibrate 160 MG tablet TAKE 1 TABLET BY MOUTH  DAILY 90 tablet 3  ? gabapentin (NEURONTIN) 300 MG capsule TAKE 1 CAPSULE BY MOUTH AT  BEDTIME 90 capsule 3  ? lovastatin (MEVACOR) 20 MG tablet TAKE 1 TABLET BY MOUTH AT  BEDTIME 90 tablet 3  ? Multiple Vitamin (MULTI-VITAMIN DAILY PO) Take by mouth.    ? Omega-3 Fatty Acids (FISH OIL) 1000 MG CAPS Take by mouth.    ? sertraline (ZOLOFT) 100 MG tablet TAKE 1 AND 1/2 TABLETS BY  MOUTH DAILY 135 tablet 3  ? TREMFYA 100 MG/ML SOSY Inject 1 Syringe into the skin every 8 (eight) weeks.    ? Vitamin D, Ergocalciferol, (DRISDOL) 1.25 MG (50000 UNIT) CAPS capsule Take 1 capsule (50,000 Units total) by mouth every 7 (seven) days. 12 capsule 2  ? ondansetron (ZOFRAN) 4 MG tablet Take 1 tablet (4 mg total) by mouth every 8 (eight) hours as needed for nausea or vomiting. (Patient not taking: Reported on 02/18/2022) 20 tablet 0  ? oxyCODONE-acetaminophen (PERCOCET) 5-325 MG tablet Take 1 tablet by mouth every 4 (four) hours as needed for severe pain. (Patient not taking: Reported on 02/18/2022) 20 tablet 0  ? triamcinolone cream (KENALOG) 0.1 % Apply topically. (Patient not taking: Reported on 02/18/2022)    ? ?No facility-administered medications prior to visit.  ? ? ?No Known Allergies ? ?Review of Systems  ?Constitutional:  Negative for fever and malaise/fatigue.  ?HENT:  Negative for congestion.   ?Eyes:  Negative for blurred vision.  ?Respiratory:  Negative for shortness of breath.   ?Cardiovascular:  Negative for chest pain, palpitations and leg swelling.  ?Gastrointestinal:  Negative for abdominal pain, blood in stool and nausea.  ?Genitourinary:  Negative for dysuria and frequency.  ?Musculoskeletal:  Negative for  falls.  ?Skin:  Negative for rash.  ?Neurological:  Negative for dizziness, loss of consciousness and headaches.  ?Endo/Heme/Allergies:  Negative for environmental allergies.  ?Psychiatric/Behavioral:  Negative for depression. The patient is not nervous/anxious.   ? ?   ?Objective:  ?  ?Physical Exam ?Vitals and nursing note reviewed.  ?Constitutional:   ?   General: She is not in acute distress. ?   Appearance: Normal appearance. She is not ill-appearing.  ?HENT:  ?   Head: Normocephalic and atraumatic.  ?   Right Ear: Tympanic membrane,  ear canal and external ear normal.  ?   Left Ear: Tympanic membrane, ear canal and external ear normal.  ?   Nose: Nose normal.  ?   Mouth/Throat:  ?   Mouth: Mucous membranes are moist.  ?Eyes:  ?   Extraocular Movements: Extraocular movements intact.  ?   Pupils: Pupils are equal, round, and reactive to light.  ?Neck:  ?   Vascular: No carotid bruit.  ?Cardiovascular:  ?   Rate and Rhythm: Normal rate and regular rhythm.  ?   Heart sounds: Normal heart sounds. No murmur heard. ?  No gallop.  ?Pulmonary:  ?   Effort: Pulmonary effort is normal. No respiratory distress.  ?   Breath sounds: Normal breath sounds. No wheezing or rales.  ?Abdominal:  ?   General: Bowel sounds are normal. There is no distension.  ?   Palpations: Abdomen is soft.  ?   Tenderness: There is no abdominal tenderness. There is no guarding.  ?Musculoskeletal:  ?   Cervical back: Normal range of motion and neck supple.  ?Lymphadenopathy:  ?   Cervical: No cervical adenopathy.  ?Skin: ?   General: Skin is warm and dry.  ?Neurological:  ?   General: No focal deficit present.  ?   Mental Status: She is alert and oriented to person, place, and time.  ?Psychiatric:     ?   Mood and Affect: Mood normal.     ?   Behavior: Behavior normal.     ?   Thought Content: Thought content normal.     ?   Judgment: Judgment normal.  ? ? ?BP 110/70 (BP Location: Right Arm, Patient Position: Sitting, Cuff Size: Large)   Pulse 80    Temp 98.4 ?F (36.9 ?C) (Oral)   Resp 18   Ht '5\' 2"'$  (1.575 m)   Wt 239 lb 12.8 oz (108.8 kg)   SpO2 97%   BMI 43.86 kg/m?  ?Wt Readings from Last 3 Encounters:  ?02/18/22 239 lb 12.8 oz (108.8 kg)  ?03/21/

## 2022-02-19 LAB — CBC WITH DIFFERENTIAL/PLATELET
Basophils Absolute: 0.1 10*3/uL (ref 0.0–0.1)
Basophils Relative: 1 % (ref 0.0–3.0)
Eosinophils Absolute: 0.1 10*3/uL (ref 0.0–0.7)
Eosinophils Relative: 1.8 % (ref 0.0–5.0)
HCT: 38.9 % (ref 36.0–46.0)
Hemoglobin: 12.8 g/dL (ref 12.0–15.0)
Lymphocytes Relative: 23 % (ref 12.0–46.0)
Lymphs Abs: 1.5 10*3/uL (ref 0.7–4.0)
MCHC: 32.8 g/dL (ref 30.0–36.0)
MCV: 99.2 fl (ref 78.0–100.0)
Monocytes Absolute: 0.6 10*3/uL (ref 0.1–1.0)
Monocytes Relative: 8.5 % (ref 3.0–12.0)
Neutro Abs: 4.4 10*3/uL (ref 1.4–7.7)
Neutrophils Relative %: 65.7 % (ref 43.0–77.0)
Platelets: 209 10*3/uL (ref 150.0–400.0)
RBC: 3.92 Mil/uL (ref 3.87–5.11)
RDW: 13.9 % (ref 11.5–15.5)
WBC: 6.6 10*3/uL (ref 4.0–10.5)

## 2022-02-19 LAB — LIPID PANEL
Cholesterol: 166 mg/dL (ref 0–200)
HDL: 43.1 mg/dL (ref >39.00)
NonHDL: 122.52
Total CHOL/HDL Ratio: 4
Triglycerides: 233 mg/dL — ABNORMAL HIGH (ref 0.0–149.0)
VLDL: 46.6 mg/dL — ABNORMAL HIGH (ref 0.0–40.0)

## 2022-02-19 LAB — COMPREHENSIVE METABOLIC PANEL
ALT: 30 U/L (ref 0–35)
AST: 30 U/L (ref 0–37)
Albumin: 4.7 g/dL (ref 3.5–5.2)
Alkaline Phosphatase: 42 U/L (ref 39–117)
BUN: 26 mg/dL — ABNORMAL HIGH (ref 6–23)
CO2: 25 mEq/L (ref 19–32)
Calcium: 10.1 mg/dL (ref 8.4–10.5)
Chloride: 103 mEq/L (ref 96–112)
Creatinine, Ser: 0.88 mg/dL (ref 0.40–1.20)
GFR: 70.5 mL/min (ref >60.00)
Glucose, Bld: 99 mg/dL (ref 70–99)
Potassium: 4.4 mEq/L (ref 3.5–5.1)
Sodium: 139 mEq/L (ref 135–145)
Total Bilirubin: 0.5 mg/dL (ref 0.2–1.2)
Total Protein: 7.6 g/dL (ref 6.0–8.3)

## 2022-02-19 LAB — LDL CHOLESTEROL, DIRECT: Direct LDL: 99 mg/dL

## 2022-02-19 LAB — VITAMIN D 25 HYDROXY (VIT D DEFICIENCY, FRACTURES): VITD: 38.54 ng/mL (ref 30.00–100.00)

## 2022-02-25 ENCOUNTER — Telehealth: Payer: Self-pay

## 2022-02-25 ENCOUNTER — Telehealth: Payer: Self-pay | Admitting: Family Medicine

## 2022-02-25 DIAGNOSIS — G4733 Obstructive sleep apnea (adult) (pediatric): Secondary | ICD-10-CM | POA: Diagnosis not present

## 2022-02-25 NOTE — Telephone Encounter (Signed)
Patient would like lab results mailed to her home from 4/25 ? ?Please advise  ?

## 2022-02-25 NOTE — Telephone Encounter (Signed)
Results was Sent ?

## 2022-02-25 NOTE — Telephone Encounter (Signed)
Open in error

## 2022-02-26 ENCOUNTER — Other Ambulatory Visit: Payer: Self-pay | Admitting: Family Medicine

## 2022-02-26 DIAGNOSIS — R809 Proteinuria, unspecified: Secondary | ICD-10-CM

## 2022-02-27 ENCOUNTER — Other Ambulatory Visit: Payer: Self-pay | Admitting: Family Medicine

## 2022-02-27 DIAGNOSIS — R809 Proteinuria, unspecified: Secondary | ICD-10-CM

## 2022-03-26 DIAGNOSIS — G4733 Obstructive sleep apnea (adult) (pediatric): Secondary | ICD-10-CM | POA: Diagnosis not present

## 2022-03-28 DIAGNOSIS — G4733 Obstructive sleep apnea (adult) (pediatric): Secondary | ICD-10-CM | POA: Diagnosis not present

## 2022-04-08 DIAGNOSIS — M720 Palmar fascial fibromatosis [Dupuytren]: Secondary | ICD-10-CM | POA: Diagnosis not present

## 2022-04-08 DIAGNOSIS — M79642 Pain in left hand: Secondary | ICD-10-CM | POA: Diagnosis not present

## 2022-04-08 DIAGNOSIS — M79641 Pain in right hand: Secondary | ICD-10-CM | POA: Diagnosis not present

## 2022-04-10 DIAGNOSIS — G4733 Obstructive sleep apnea (adult) (pediatric): Secondary | ICD-10-CM | POA: Diagnosis not present

## 2022-04-10 DIAGNOSIS — G4761 Periodic limb movement disorder: Secondary | ICD-10-CM | POA: Diagnosis not present

## 2022-04-10 DIAGNOSIS — R400 Somnolence: Secondary | ICD-10-CM | POA: Diagnosis not present

## 2022-04-15 DIAGNOSIS — L400 Psoriasis vulgaris: Secondary | ICD-10-CM | POA: Diagnosis not present

## 2022-04-27 DIAGNOSIS — G4733 Obstructive sleep apnea (adult) (pediatric): Secondary | ICD-10-CM | POA: Diagnosis not present

## 2022-05-23 DIAGNOSIS — D3131 Benign neoplasm of right choroid: Secondary | ICD-10-CM | POA: Diagnosis not present

## 2022-05-23 DIAGNOSIS — H524 Presbyopia: Secondary | ICD-10-CM | POA: Diagnosis not present

## 2022-05-28 DIAGNOSIS — G4733 Obstructive sleep apnea (adult) (pediatric): Secondary | ICD-10-CM | POA: Diagnosis not present

## 2022-05-30 DIAGNOSIS — G4733 Obstructive sleep apnea (adult) (pediatric): Secondary | ICD-10-CM | POA: Diagnosis not present

## 2022-06-18 DIAGNOSIS — M25532 Pain in left wrist: Secondary | ICD-10-CM | POA: Diagnosis not present

## 2022-06-18 DIAGNOSIS — M25531 Pain in right wrist: Secondary | ICD-10-CM | POA: Diagnosis not present

## 2022-06-18 DIAGNOSIS — R200 Anesthesia of skin: Secondary | ICD-10-CM | POA: Diagnosis not present

## 2022-06-27 ENCOUNTER — Other Ambulatory Visit: Payer: Self-pay | Admitting: Family Medicine

## 2022-06-27 DIAGNOSIS — F320 Major depressive disorder, single episode, mild: Secondary | ICD-10-CM

## 2022-06-27 DIAGNOSIS — E785 Hyperlipidemia, unspecified: Secondary | ICD-10-CM

## 2022-06-28 DIAGNOSIS — G4733 Obstructive sleep apnea (adult) (pediatric): Secondary | ICD-10-CM | POA: Diagnosis not present

## 2022-07-04 DIAGNOSIS — J069 Acute upper respiratory infection, unspecified: Secondary | ICD-10-CM | POA: Diagnosis not present

## 2022-07-04 DIAGNOSIS — J0100 Acute maxillary sinusitis, unspecified: Secondary | ICD-10-CM | POA: Diagnosis not present

## 2022-07-07 DIAGNOSIS — L400 Psoriasis vulgaris: Secondary | ICD-10-CM | POA: Diagnosis not present

## 2022-07-22 DIAGNOSIS — M79642 Pain in left hand: Secondary | ICD-10-CM | POA: Diagnosis not present

## 2022-07-22 DIAGNOSIS — M79641 Pain in right hand: Secondary | ICD-10-CM | POA: Diagnosis not present

## 2022-07-22 DIAGNOSIS — M720 Palmar fascial fibromatosis [Dupuytren]: Secondary | ICD-10-CM | POA: Diagnosis not present

## 2022-07-24 DIAGNOSIS — H43813 Vitreous degeneration, bilateral: Secondary | ICD-10-CM | POA: Diagnosis not present

## 2022-07-28 DIAGNOSIS — G4733 Obstructive sleep apnea (adult) (pediatric): Secondary | ICD-10-CM | POA: Diagnosis not present

## 2022-07-31 DIAGNOSIS — R400 Somnolence: Secondary | ICD-10-CM | POA: Diagnosis not present

## 2022-07-31 DIAGNOSIS — G4733 Obstructive sleep apnea (adult) (pediatric): Secondary | ICD-10-CM | POA: Diagnosis not present

## 2022-07-31 DIAGNOSIS — G4761 Periodic limb movement disorder: Secondary | ICD-10-CM | POA: Diagnosis not present

## 2022-08-12 DIAGNOSIS — L400 Psoriasis vulgaris: Secondary | ICD-10-CM | POA: Diagnosis not present

## 2022-08-21 ENCOUNTER — Ambulatory Visit (INDEPENDENT_AMBULATORY_CARE_PROVIDER_SITE_OTHER): Payer: Medicare Other | Admitting: Family Medicine

## 2022-08-21 ENCOUNTER — Encounter: Payer: Self-pay | Admitting: Family Medicine

## 2022-08-21 VITALS — BP 116/70 | HR 78 | Temp 98.4°F | Resp 18 | Ht 62.0 in | Wt 240.4 lb

## 2022-08-21 DIAGNOSIS — Z23 Encounter for immunization: Secondary | ICD-10-CM | POA: Diagnosis not present

## 2022-08-21 DIAGNOSIS — M79641 Pain in right hand: Secondary | ICD-10-CM | POA: Diagnosis not present

## 2022-08-21 DIAGNOSIS — M79642 Pain in left hand: Secondary | ICD-10-CM | POA: Diagnosis not present

## 2022-08-21 DIAGNOSIS — E785 Hyperlipidemia, unspecified: Secondary | ICD-10-CM | POA: Diagnosis not present

## 2022-08-21 DIAGNOSIS — E782 Mixed hyperlipidemia: Secondary | ICD-10-CM

## 2022-08-21 DIAGNOSIS — E559 Vitamin D deficiency, unspecified: Secondary | ICD-10-CM

## 2022-08-21 DIAGNOSIS — E2839 Other primary ovarian failure: Secondary | ICD-10-CM

## 2022-08-21 DIAGNOSIS — F320 Major depressive disorder, single episode, mild: Secondary | ICD-10-CM

## 2022-08-21 MED ORDER — FENOFIBRATE 160 MG PO TABS: 160.0000 mg | ORAL_TABLET | Freq: Every day | ORAL | 1 refills | Status: DC

## 2022-08-21 MED ORDER — LOVASTATIN 20 MG PO TABS: 20.0000 mg | ORAL_TABLET | Freq: Every day | ORAL | 1 refills | Status: DC

## 2022-08-21 MED ORDER — GABAPENTIN 300 MG PO CAPS: 300.0000 mg | ORAL_CAPSULE | Freq: Every day | ORAL | 3 refills | Status: DC

## 2022-08-21 MED ORDER — SERTRALINE HCL 100 MG PO TABS: ORAL_TABLET | ORAL | 1 refills | Status: DC

## 2022-08-21 NOTE — Assessment & Plan Note (Signed)
Check labs Take otc vita d3 1000u daily

## 2022-08-21 NOTE — Patient Instructions (Signed)

## 2022-08-21 NOTE — Progress Notes (Addendum)
Subjective:   By signing my name below, I, Shehryar Baig, attest that this documentation has been prepared under the direction and in the presence of Ann Held, DO. 08/21/2022     Patient ID: Sydney Thompson, female    DOB: [birth year], 61 y.o.   MRN: [id number]  Chief Complaint  Patient presents with   Hyperlipidemia   Follow-up    HPI Patient is in today for a follow up visit.   She is requesting a refill for 160 mg fenofibrate, 20 mg lovastatin, 100 mg sertraline, and 300 mg gabapentin.  She continues taking 1.25 mg Drisdol supplements and reports no new issues while taking it.  She is due for a mammogram and is interested in setting up an appointment.  She does not follow up with a GYN specialist regularly. She does not complete pap smears regularly.  She is interested in seeing a receiving a flu vaccine during this visit.    Past Medical History:  Diagnosis Date   Depression    GERD (gastroesophageal reflux disease)    IBS (irritable bowel syndrome)     Past Surgical History:  Procedure Laterality Date   CHOLECYSTECTOMY     GALLBLADDER SURGERY     LITHOTRIPSY     Dr. 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  Tobacco Use   Smoking status: Never   Smokeless tobacco: Never  Substance and Sexual Activity   Alcohol use: No   Drug use: No   Sexual activity: Yes    Partners: Female    Birth control/protection:  None  Other Topics Concern   Not on file  Social History Narrative   Exercise-- no   Social Determinants of Health   Financial Resource Strain: Low Risk  (01/14/2022)   Overall Financial Resource Strain (CARDIA)    Difficulty of Paying Living Expenses: Not hard at all  Food Insecurity: No Food Insecurity (01/14/2022)   Hunger Vital Sign    Worried About Running Out of Food in the Last Year: Never true    Ran Out of Food in the Last Year: Never true  Transportation Needs: Unmet Transportation Needs (01/14/2022)   PRAPARE - Transportation    Lack of Transportation (Medical): Yes    Lack of Transportation (Non-Medical): Yes  Physical Activity: Unknown (01/14/2022)   Exercise Vital Sign    Days of Exercise per Week: 0 days    Minutes of Exercise per Session: Not on file  Stress: No Stress Concern Present (01/14/2022)   [place]    Feeling of Stress : Only a little  Social Connections: Moderately Integrated (01/14/2022)   Social Connection and Isolation Panel [NHANES]    Frequency of Communication with Friends and Family:  More than three times a week    Frequency of Social Gatherings with Friends and Family: More than three times a week    Attends Religious Services: Never    Marine scientist or Organizations: Yes    Attends Archivist Meetings: 1 to 4 times per year    Marital Status: Living with partner  Intimate Partner Violence: Not At Risk (01/14/2022)   Humiliation, Afraid, Rape, and Kick questionnaire    Fear of Current or Ex-Partner: No    Emotionally Abused: No    Physically Abused: No    Sexually Abused: No    Outpatient Medications Prior to Visit  Medication Sig Dispense Refill   Multiple Vitamin (MULTI-VITAMIN DAILY PO) Take by mouth.     Omega-3 Fatty Acids (FISH OIL) 1000 MG CAPS Take by mouth.     TREMFYA 100 MG/ML SOSY Inject 1 Syringe into the skin every 8 (eight) weeks.     Vitamin  D, Ergocalciferol, (DRISDOL) 1.25 MG (50000 UNIT) CAPS capsule Take 1 capsule (50,000 Units total) by mouth every 7 (seven) days. 12 capsule 2   fenofibrate 160 MG tablet Take 1 tablet (160 mg total) by mouth daily. 90 tablet 1   gabapentin (NEURONTIN) 300 MG capsule TAKE 1 CAPSULE BY MOUTH AT  BEDTIME 90 capsule 3   lovastatin (MEVACOR) 20 MG tablet Take 1 tablet (20 mg total) by mouth at bedtime. 90 tablet 1   sertraline (ZOLOFT) 100 MG tablet TAKE 1 AND 1/2 TABLETS BY  MOUTH DAILY 135 tablet 1   No facility-administered medications prior to visit.    No Known Allergies  Review of Systems  Constitutional:  Negative for fever.  HENT:  Negative for congestion.   Eyes:  Negative for blurred vision.  Respiratory:  Negative for cough.   Cardiovascular:  Negative for chest pain and palpitations.  Gastrointestinal:  Negative for vomiting.  Musculoskeletal:  Negative for back pain.  Skin:  Negative for rash.  Neurological:  Negative for loss of consciousness and headaches.       Objective:    Physical Exam Vitals and nursing note reviewed. Exam conducted with a chaperone present.  Constitutional:      General: She is not in acute distress.    Appearance: Normal appearance. She is well-developed. She is not ill-appearing.  HENT:     Head: Normocephalic and atraumatic.     Right Ear: External ear normal.     Left Ear: External ear normal.  Eyes:     Extraocular Movements: Extraocular movements intact.     Conjunctiva/sclera: Conjunctivae normal.     Pupils: Pupils are equal, round, and reactive to light.  Neck:     Thyroid: No thyromegaly.     Vascular: No carotid bruit or JVD.  Cardiovascular:     Rate and Rhythm: Normal rate and regular rhythm.     Heart sounds: Normal heart sounds. No murmur heard.    No gallop.  Pulmonary:     Effort: Pulmonary effort is normal. No respiratory distress.     Breath sounds: Normal breath sounds. No wheezing or rales.  Chest:     Chest  wall: No tenderness.  Musculoskeletal:     Cervical back: Normal range of motion and neck supple.  Skin:    General: Skin is warm and dry.  Neurological:     Mental Status: She is alert and oriented to person, place, and time.  Psychiatric:        Judgment:  Judgment normal.     BP 116/70 (BP Location: Left Arm, Patient Position: Sitting, Cuff Size: Large)   Pulse 78   Temp 98.4 F (36.9 C) (Oral)   Resp 18   Ht '5\' 2"'$  (1.575 m)   Wt 240 lb 6.4 oz (109 kg)   SpO2 95%   BMI 43.97 kg/m  Wt Readings from Last 3 Encounters:  08/21/22 240 lb 6.4 oz (109 kg)  02/18/22 239 lb 12.8 oz (108.8 kg)  01/14/22 235 lb (106.6 kg)    Diabetic Foot Exam - Simple   No data filed    Lab Results  Component Value Date   WBC 6.6 02/18/2022   HGB 12.8 02/18/2022   HCT 38.9 02/18/2022   PLT 209.0 02/18/2022   GLUCOSE 99 02/18/2022   CHOL 166 02/18/2022   TRIG 233.0 (H) 02/18/2022   HDL 43.10 02/18/2022   LDLDIRECT 99.0 02/18/2022   LDLCALC 76 08/10/2020   ALT 30 02/18/2022   AST 30 02/18/2022   NA 139 02/18/2022   K 4.4 02/18/2022   CL 103 02/18/2022   CREATININE 0.88 02/18/2022   BUN 26 (H) 02/18/2022   CO2 25 02/18/2022   TSH 4.94 08/19/2021   INR 1.0 07/17/2009   HGBA1C 5.9 08/16/2018   MICROALBUR 18.5 (H) 02/18/2022    Lab Results  Component Value Date   TSH 4.94 08/19/2021   Lab Results  Component Value Date   WBC 6.6 02/18/2022   HGB 12.8 02/18/2022   HCT 38.9 02/18/2022   MCV 99.2 02/18/2022   PLT 209.0 02/18/2022   Lab Results  Component Value Date   NA 139 02/18/2022   K 4.4 02/18/2022   CO2 25 02/18/2022   GLUCOSE 99 02/18/2022   BUN 26 (H) 02/18/2022   CREATININE 0.88 02/18/2022   BILITOT 0.5 02/18/2022   ALKPHOS 42 02/18/2022   AST 30 02/18/2022   ALT 30 02/18/2022   PROT 7.6 02/18/2022   ALBUMIN 4.7 02/18/2022   CALCIUM 10.1 02/18/2022   GFR 70.50 02/18/2022   Lab Results  Component Value Date   CHOL 166 02/18/2022   Lab Results   Component Value Date   HDL 43.10 02/18/2022   Lab Results  Component Value Date   LDLCALC 76 08/10/2020   Lab Results  Component Value Date   TRIG 233.0 (H) 02/18/2022   Lab Results  Component Value Date   CHOLHDL 4 02/18/2022   Lab Results  Component Value Date   HGBA1C 5.9 08/16/2018       Assessment & Plan:   Problem List Items Addressed This Visit       Unprioritized   Bilateral hand pain   Relevant Medications   gabapentin (NEURONTIN) 300 MG capsule   Vitamin D deficiency    Check labs Take otc vita d3 1000u daily      Relevant Orders   VITAMIN D 25 Hydroxy (Vit-D Deficiency, Fractures)   Hyperlipidemia - Primary    Encourage heart healthy diet such as MIND or DASH diet, increase exercise, avoid trans fats, simple carbohydrates and processed foods, consider a krill or fish or flaxseed oil cap daily.       Relevant Medications   fenofibrate 160 MG tablet   lovastatin (MEVACOR) 20 MG tablet   Other Relevant Orders   Comprehensive metabolic panel   Lipid panel   Other Visit Diagnoses     Need for influenza vaccination       Relevant Orders   Flu Vaccine QUAD  71moIM (Fluarix, Fluzone & Alfiuria Quad PF) (Completed)   Depression, major, single episode, mild (HCC)       Relevant Medications   sertraline (ZOLOFT) 100 MG tablet   Estrogen deficiency       Relevant Orders   DG Bone Density        Meds ordered this encounter  Medications   fenofibrate 160 MG tablet    Sig: Take 1 tablet (160 mg total) by mouth daily.    Dispense:  90 tablet    Refill:  1   gabapentin (NEURONTIN) 300 MG capsule    Sig: Take 1 capsule (300 mg total) by mouth at bedtime.    Dispense:  90 capsule    Refill:  3    Requesting 1 year supply   lovastatin (MEVACOR) 20 MG tablet    Sig: Take 1 tablet (20 mg total) by mouth at bedtime.    Dispense:  90 tablet    Refill:  1   sertraline (ZOLOFT) 100 MG tablet    Sig: TAKE 1 AND 1/2 TABLETS BY  MOUTH DAILY    Dispense:   135 tablet    Refill:  1    I, YAnn Held DO, personally preformed the services described in this documentation.  All medical record entries made by the scribe were at my direction and in my presence.  I have reviewed the chart and discharge instructions (if applicable) and agree that the record reflects my personal performance and is accurate and complete. 08/21/2022   I,Shehryar Baig,acting as a scribe for YAnn Held DO.,have documented all relevant documentation on the behalf of YAnn Held DO,as directed by  YAnn Held DO while in the presence of YAnn Held DO.   YAnn Held DO

## 2022-08-21 NOTE — Assessment & Plan Note (Signed)
Encourage heart healthy diet such as MIND or DASH diet, increase exercise, avoid trans fats, simple carbohydrates and processed foods, consider a krill or fish or flaxseed oil cap daily.  °

## 2022-08-22 LAB — LDL CHOLESTEROL, DIRECT: Direct LDL: 89 mg/dL

## 2022-08-22 LAB — COMPREHENSIVE METABOLIC PANEL
ALT: 29 U/L (ref 0–35)
AST: 35 U/L (ref 0–37)
Albumin: 4.4 g/dL (ref 3.5–5.2)
Alkaline Phosphatase: 41 U/L (ref 39–117)
BUN: 18 mg/dL (ref 6–23)
CO2: 25 mEq/L (ref 19–32)
Calcium: 9.6 mg/dL (ref 8.4–10.5)
Chloride: 106 mEq/L (ref 96–112)
Creatinine, Ser: 0.85 mg/dL (ref 0.40–1.20)
GFR: 73.24 mL/min (ref >60.00)
Glucose, Bld: 101 mg/dL — ABNORMAL HIGH (ref 70–99)
Potassium: 4.3 mEq/L (ref 3.5–5.1)
Sodium: 140 mEq/L (ref 135–145)
Total Bilirubin: 0.4 mg/dL (ref 0.2–1.2)
Total Protein: 6.9 g/dL (ref 6.0–8.3)

## 2022-08-22 LAB — LIPID PANEL
Cholesterol: 140 mg/dL (ref 0–200)
HDL: 31.5 mg/dL — ABNORMAL LOW (ref >39.00)
NonHDL: 108.57
Total CHOL/HDL Ratio: 4
Triglycerides: 207 mg/dL — ABNORMAL HIGH (ref 0.0–149.0)
VLDL: 41.4 mg/dL — ABNORMAL HIGH (ref 0.0–40.0)

## 2022-08-22 LAB — VITAMIN D 25 HYDROXY (VIT D DEFICIENCY, FRACTURES): VITD: 36.39 ng/mL (ref 30.00–100.00)

## 2022-08-28 DIAGNOSIS — G4733 Obstructive sleep apnea (adult) (pediatric): Secondary | ICD-10-CM | POA: Diagnosis not present

## 2022-09-02 DIAGNOSIS — M720 Palmar fascial fibromatosis [Dupuytren]: Secondary | ICD-10-CM | POA: Diagnosis not present

## 2022-09-03 ENCOUNTER — Telehealth: Payer: Self-pay | Admitting: Family Medicine

## 2022-09-03 NOTE — Telephone Encounter (Signed)
Letter placed in mail.

## 2022-09-03 NOTE — Telephone Encounter (Signed)
Patient called to request a letter with lab results to be mailed to home.

## 2022-09-15 DIAGNOSIS — L400 Psoriasis vulgaris: Secondary | ICD-10-CM | POA: Diagnosis not present

## 2022-09-21 ENCOUNTER — Other Ambulatory Visit: Payer: Self-pay | Admitting: Family Medicine

## 2022-09-21 DIAGNOSIS — E559 Vitamin D deficiency, unspecified: Secondary | ICD-10-CM

## 2022-09-27 DIAGNOSIS — G4733 Obstructive sleep apnea (adult) (pediatric): Secondary | ICD-10-CM | POA: Diagnosis not present

## 2022-10-03 DIAGNOSIS — M720 Palmar fascial fibromatosis [Dupuytren]: Secondary | ICD-10-CM | POA: Diagnosis not present

## 2022-10-09 DIAGNOSIS — M25641 Stiffness of right hand, not elsewhere classified: Secondary | ICD-10-CM | POA: Diagnosis not present

## 2022-10-09 DIAGNOSIS — M720 Palmar fascial fibromatosis [Dupuytren]: Secondary | ICD-10-CM | POA: Insufficient documentation

## 2022-10-28 DIAGNOSIS — G4733 Obstructive sleep apnea (adult) (pediatric): Secondary | ICD-10-CM | POA: Diagnosis not present

## 2022-11-10 DIAGNOSIS — L400 Psoriasis vulgaris: Secondary | ICD-10-CM | POA: Diagnosis not present

## 2022-11-21 DIAGNOSIS — G4733 Obstructive sleep apnea (adult) (pediatric): Secondary | ICD-10-CM | POA: Diagnosis not present

## 2022-11-28 DIAGNOSIS — G4733 Obstructive sleep apnea (adult) (pediatric): Secondary | ICD-10-CM | POA: Diagnosis not present

## 2022-12-17 LAB — HM MAMMOGRAPHY

## 2022-12-18 DIAGNOSIS — Z1231 Encounter for screening mammogram for malignant neoplasm of breast: Secondary | ICD-10-CM | POA: Diagnosis not present

## 2023-01-06 DIAGNOSIS — L400 Psoriasis vulgaris: Secondary | ICD-10-CM | POA: Diagnosis not present

## 2023-01-06 DIAGNOSIS — L4050 Arthropathic psoriasis, unspecified: Secondary | ICD-10-CM | POA: Diagnosis not present

## 2023-01-29 ENCOUNTER — Telehealth: Payer: Self-pay | Admitting: Family Medicine

## 2023-01-29 NOTE — Telephone Encounter (Signed)
Copied from [place] [id number]. Topic: Medicare AWV >> Jan 29, 2023  2:01 PM Devoria Glassing wrote: Reason for CRM: Called patient to schedule Medicare Annual Wellness Visit (AWV). Left message for patient to call back and schedule Medicare Annual Wellness Visit (AWV).  Last date of AWV: 01/14/22  Please schedule an appointment at any time with Beatris Ship, [place]  .  If any questions, please contact me.  Thank you ,  Sherol Dade; [place] Direct Dial: [phone]

## 2023-02-05 DIAGNOSIS — G4733 Obstructive sleep apnea (adult) (pediatric): Secondary | ICD-10-CM | POA: Diagnosis not present

## 2023-02-05 DIAGNOSIS — R400 Somnolence: Secondary | ICD-10-CM | POA: Diagnosis not present

## 2023-02-05 DIAGNOSIS — G4761 Periodic limb movement disorder: Secondary | ICD-10-CM | POA: Diagnosis not present

## 2023-02-16 DIAGNOSIS — R531 Weakness: Secondary | ICD-10-CM | POA: Diagnosis not present

## 2023-02-16 DIAGNOSIS — L400 Psoriasis vulgaris: Secondary | ICD-10-CM | POA: Diagnosis not present

## 2023-02-20 ENCOUNTER — Ambulatory Visit (INDEPENDENT_AMBULATORY_CARE_PROVIDER_SITE_OTHER): Payer: Medicare Other | Admitting: Family Medicine

## 2023-02-20 ENCOUNTER — Encounter: Payer: Self-pay | Admitting: Family Medicine

## 2023-02-20 VITALS — BP 100/70 | HR 89 | Temp 98.0°F | Resp 18 | Ht 62.0 in | Wt 221.8 lb

## 2023-02-20 DIAGNOSIS — E782 Mixed hyperlipidemia: Secondary | ICD-10-CM

## 2023-02-20 DIAGNOSIS — Z0000 Encounter for general adult medical examination without abnormal findings: Secondary | ICD-10-CM

## 2023-02-20 DIAGNOSIS — F320 Major depressive disorder, single episode, mild: Secondary | ICD-10-CM

## 2023-02-20 DIAGNOSIS — E2839 Other primary ovarian failure: Secondary | ICD-10-CM

## 2023-02-20 DIAGNOSIS — M79642 Pain in left hand: Secondary | ICD-10-CM

## 2023-02-20 DIAGNOSIS — M79641 Pain in right hand: Secondary | ICD-10-CM | POA: Diagnosis not present

## 2023-02-20 DIAGNOSIS — E785 Hyperlipidemia, unspecified: Secondary | ICD-10-CM

## 2023-02-20 DIAGNOSIS — E559 Vitamin D deficiency, unspecified: Secondary | ICD-10-CM

## 2023-02-20 DIAGNOSIS — G4733 Obstructive sleep apnea (adult) (pediatric): Secondary | ICD-10-CM | POA: Diagnosis not present

## 2023-02-20 MED ORDER — GABAPENTIN 300 MG PO CAPS: 300.0000 mg | ORAL_CAPSULE | Freq: Every day | ORAL | 3 refills | Status: DC

## 2023-02-20 MED ORDER — FENOFIBRATE 160 MG PO TABS: 160.0000 mg | ORAL_TABLET | Freq: Every day | ORAL | 1 refills | Status: DC

## 2023-02-20 MED ORDER — SERTRALINE HCL 100 MG PO TABS: ORAL_TABLET | ORAL | 1 refills | Status: DC

## 2023-02-20 MED ORDER — VITAMIN D (ERGOCALCIFEROL) 1.25 MG (50000 UNIT) PO CAPS: 50000.0000 [IU] | ORAL_CAPSULE | ORAL | 1 refills | Status: DC

## 2023-02-20 MED ORDER — LOVASTATIN 20 MG PO TABS: 20.0000 mg | ORAL_TABLET | Freq: Every day | ORAL | 1 refills | Status: DC

## 2023-02-20 NOTE — Assessment & Plan Note (Signed)
Encourage heart healthy diet such as MIND or DASH diet, increase exercise, avoid trans fats, simple carbohydrates and processed foods, consider a krill or fish or flaxseed oil cap daily.  °

## 2023-02-20 NOTE — Assessment & Plan Note (Signed)
Ghm utd Check labs  Health Maintenance  Topic Date Due   Zoster Vaccines- Shingrix (1 of 2) Never done   DTaP/Tdap/Td (2 - Tdap) 02/21/2020   COVID-19 Vaccine (5 - 2023-24 season) 06/27/2022   Medicare Annual Wellness (AWV)  01/15/2023   PAP SMEAR-Modifier  02/20/2023 (Originally 11/17/2014)   Hepatitis C Screening  08/16/2024 (Originally 11/06/1977)   HIV Screening  08/16/2024 (Originally 11/06/1974)   INFLUENZA VACCINE  05/28/2023   MAMMOGRAM  12/18/2023   COLONOSCOPY (Pts 45-29yrs Insurance coverage will need to be confirmed)  12/27/2030   HPV VACCINES  Aged Out

## 2023-02-20 NOTE — Progress Notes (Signed)
Established Patient Office Visit  Subjective   Patient ID: Sydney Thompson, female    DOB: Oct 23, 1961  Age: 62 y.o. MRN: [id number]  Chief Complaint  Patient presents with   Annual Exam    Pt states fasting     HPI Pt is here for cpe  Patient Active Problem List   Diagnosis Date Noted   Vitamin D deficiency 08/19/2021   Internal hemorrhoid 08/19/2021   Itching of ear 05/07/2020   Dizziness 05/07/2020   Bilateral hand pain 05/07/2020   Cervical arthritis 11/18/2018   Chronic bilateral low back pain without sciatica 11/18/2018   Preventative health care 01/19/2017   OSA (obstructive sleep apnea) 04/29/2011   CARPAL TUNNEL SYNDROME, BILATERAL 11/26/2010   Hyperlipidemia 11/05/2010   CERVICAL LYMPHADENOPATHY 11/05/2010   LIPOMA OF OTHER SKIN AND SUBCUTANEOUS TISSUE 02/20/2010   ABDOMINAL BLOATING 02/20/2010   POSTMENOPAUSAL STATUS 02/20/2010   Depression, major, single episode, complete remission (HCC) 10/17/2006   GERD 10/17/2006   IRRITABLE BOWEL SYNDROME 10/17/2006   Past Medical History:  Diagnosis Date   Depression    GERD (gastroesophageal reflux disease)    IBS (irritable bowel syndrome)    Past Surgical History:  Procedure Laterality Date   CHOLECYSTECTOMY     GALLBLADDER SURGERY     LITHOTRIPSY     Dr. 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    Comment: retired  Tobacco Use   Smoking status: Never   Smokeless tobacco: Never  Substance and Sexual Activity   Alcohol use: No   Drug use: No   Sexual activity: Yes    Partners: Female    Birth control/protection:  None  Other Topics Concern   Not on file  Social History Narrative   Exercise-- walking some    Social Determinants of Health   Financial Resource Strain: Low Risk  (01/14/2022)   Overall Financial Resource Strain (CARDIA)    Difficulty of Paying Living Expenses: Not hard at all  Food Insecurity: No Food Insecurity (01/14/2022)   Hunger Vital Sign    Worried About Running Out of Food in the Last Year: Never true    Ran Out of Food in the Last Year: Never true  Transportation Needs: Unmet Transportation Needs (01/14/2022)   PRAPARE - Transportation    Lack of Transportation (Medical): Yes    Lack of Transportation (Non-Medical): Yes  Physical Activity: Unknown (01/14/2022)   Exercise Vital Sign    Days of Exercise per Week: 0 days    Minutes of Exercise per Session: Not on file  Stress: No Stress Concern Present (01/14/2022)   [employer] of Occupational Health - Occupational Stress Questionnaire    Feeling of Stress : Only a little  Social Connections: Moderately Integrated (01/14/2022)   Social Connection and Isolation Panel [NHANES]    Frequency of Communication with Friends and Family: More than three times a week    Frequency of Social Gatherings with Friends and Family: More than three times a week    Attends Religious Services:  Never    Active Member of Clubs or Organizations: Yes    Attends Club or Organization Meetings: 1 to 4 times per year    Marital Status: Living with partner  Intimate Partner Violence: Not At Risk (01/14/2022)   Humiliation, Afraid, Rape, and Kick questionnaire    Fear of Current or Ex-Partner: No    Emotionally Abused: No    Physically Abused: No    Sexually Abused: No   Family Status  Relation Name Status   Mother  Deceased at age 107   Father  Deceased at age 39       MI/ stroke   Sister  Alive   MGM  Deceased   MGF  Deceased   PGM  Deceased   PGF  Deceased   Oneal Grout  (Not Specified)   Oneal Grout  (Not Specified)   Oneal Grout  (Not  Specified)   Family History  Problem Relation Age of Onset   Cancer Mother        breast   Alzheimer's disease Mother    Stroke Father    Heart disease Father        MI   Hyperlipidemia Father    Diabetes Father    Diabetes Paternal Uncle    Diabetes Paternal Uncle    Diabetes Paternal Uncle    No Known Allergies    Review of Systems  Constitutional:  Negative for chills, fever and malaise/fatigue.  HENT:  Negative for congestion and hearing loss.   Eyes:  Negative for discharge.  Respiratory:  Negative for cough, sputum production and shortness of breath.   Cardiovascular:  Negative for chest pain, palpitations and leg swelling.  Gastrointestinal:  Negative for abdominal pain, blood in stool, constipation, diarrhea, heartburn, nausea and vomiting.  Genitourinary:  Negative for dysuria, frequency, hematuria and urgency.  Musculoskeletal:  Negative for back pain, falls and myalgias.  Skin:  Negative for rash.  Neurological:  Negative for dizziness, sensory change, loss of consciousness, weakness and headaches.  Endo/Heme/Allergies:  Negative for environmental allergies. Does not bruise/bleed easily.  Psychiatric/Behavioral:  Negative for depression and suicidal ideas. The patient is not nervous/anxious and does not have insomnia.       Objective:     BP 100/70 (BP Location: Left Arm, Patient Position: Sitting, Cuff Size: Large)   Pulse 89   Temp 98 F (36.7 C) (Oral)   Resp 18   Ht 5\' 2"  (1.575 m)   Wt 221 lb 12.8 oz (100.6 kg)   SpO2 97%   BMI 40.57 kg/m  BP Readings from Last 3 Encounters:  02/20/23 100/70  08/21/22 116/70  02/18/22 110/70   Wt Readings from Last 3 Encounters:  02/20/23 221 lb 12.8 oz (100.6 kg)  08/21/22 240 lb 6.4 oz (109 kg)  02/18/22 239 lb 12.8 oz (108.8 kg)   SpO2 Readings from Last 3 Encounters:  02/20/23 97%  08/21/22 95%  02/18/22 97%      Physical Exam Vitals and nursing note reviewed.  Constitutional:      General: She  is not in acute distress.    Appearance: She is well-developed.  HENT:     Right Ear: External ear normal.     Left Ear: External ear normal.     Nose: Nose normal.  Eyes:     Pupils: Pupils are equal, round, and reactive to light.  Cardiovascular:     Rate and Rhythm: Normal rate and regular rhythm.     Heart sounds:  Normal heart sounds. No murmur heard. Pulmonary:     Effort: Pulmonary effort is normal. No respiratory distress.     Breath sounds: Normal breath sounds. No wheezing or rales.  Chest:     Chest wall: No tenderness.  Musculoskeletal:     Cervical back: Normal range of motion and neck supple.  Neurological:     Mental Status: She is alert and oriented to person, place, and time.  Psychiatric:        Behavior: Behavior normal.        Thought Content: Thought content normal.        Judgment: Judgment normal.      No results found for any visits on 02/20/23.  Last CBC Lab Results  Component Value Date   WBC 6.6 02/18/2022   HGB 12.8 02/18/2022   HCT 38.9 02/18/2022   MCV 99.2 02/18/2022   MCH 31.9 08/10/2020   RDW 13.9 02/18/2022   PLT 209.0 02/18/2022   Last metabolic panel Lab Results  Component Value Date   GLUCOSE 101 (H) 08/21/2022   NA 140 08/21/2022   K 4.3 08/21/2022   CL 106 08/21/2022   CO2 25 08/21/2022   BUN 18 08/21/2022   CREATININE 0.85 08/21/2022   CALCIUM 9.6 08/21/2022   PROT 6.9 08/21/2022   ALBUMIN 4.4 08/21/2022   BILITOT 0.4 08/21/2022   ALKPHOS 41 08/21/2022   AST 35 08/21/2022   ALT 29 08/21/2022   Last lipids Lab Results  Component Value Date   CHOL 140 08/21/2022   HDL 31.50 (L) 08/21/2022   LDLCALC 76 08/10/2020   LDLDIRECT 89.0 08/21/2022   TRIG 207.0 (H) 08/21/2022   CHOLHDL 4 08/21/2022   Last hemoglobin A1c Lab Results  Component Value Date   HGBA1C 5.9 08/16/2018   Last thyroid functions Lab Results  Component Value Date   TSH 4.94 08/19/2021   Last vitamin D Lab Results  Component Value Date    VD25OH 36.39 08/21/2022   Last vitamin B12 and Folate Lab Results  Component Value Date   VITAMINB12 374 08/19/2021      The 10-year ASCVD risk score (Arnett DK, et al., 2019) is: 5.7%    Assessment & Plan:   Problem List Items Addressed This Visit       Unprioritized   Vitamin D deficiency   Relevant Medications   Vitamin D, Ergocalciferol, (DRISDOL) 1.25 MG (50000 UNIT) CAPS capsule   Preventative health care - Primary    Ghm utd Check labs  Health Maintenance  Topic Date Due   Zoster Vaccines- Shingrix (1 of 2) Never done   DTaP/Tdap/Td (2 - Tdap) 02/21/2020   COVID-19 Vaccine (5 - 2023-24 season) 06/27/2022   Medicare Annual Wellness (AWV)  01/15/2023   PAP SMEAR-Modifier  02/20/2023 (Originally 11/17/2014)   Hepatitis C Screening  08/16/2024 (Originally 11/06/1977)   HIV Screening  08/16/2024 (Originally 11/06/1974)   INFLUENZA VACCINE  05/28/2023   MAMMOGRAM  12/18/2023   COLONOSCOPY (Pts 45-64yrs Insurance coverage will need to be confirmed)  12/27/2030   HPV VACCINES  Aged Out        OSA (obstructive sleep apnea)    Stable       Hyperlipidemia    Encourage heart healthy diet such as MIND or DASH diet, increase exercise, avoid trans fats, simple carbohydrates and processed foods, consider a krill or fish or flaxseed oil cap daily.        Relevant Medications   fenofibrate 160 MG tablet  lovastatin (MEVACOR) 20 MG tablet   Other Relevant Orders   CBC with Differential/Platelet   Comprehensive metabolic panel   Lipid panel   Bilateral hand pain   Relevant Medications   gabapentin (NEURONTIN) 300 MG capsule   Other Visit Diagnoses     Estrogen deficiency       Relevant Orders   VITAMIN D 25 Hydroxy (Vit-D Deficiency, Fractures)   DG Bone Density   Depression, major, single episode, mild (HCC)       Relevant Medications   sertraline (ZOLOFT) 100 MG tablet       Return in about 6 months (around 08/22/2023), or if symptoms worsen or fail to  improve, for hypertension, hyperlipidemia   needs awv with rn.    Donato Schultz, DO

## 2023-02-20 NOTE — Patient Instructions (Signed)
Preventive Care 40-64 Years Old, Female Preventive care refers to lifestyle choices and visits with your health care provider that can promote health and wellness. Preventive care visits are also called wellness exams. What can I expect for my preventive care visit? Counseling Your health care provider may ask you questions about your: Medical history, including: Past medical problems. Family medical history. Pregnancy history. Current health, including: Menstrual cycle. Method of birth control. Emotional well-being. Home life and relationship well-being. Sexual activity and sexual health. Lifestyle, including: Alcohol, nicotine or tobacco, and drug use. Access to firearms. Diet, exercise, and sleep habits. Work and work environment. Sunscreen use. Safety issues such as seatbelt and bike helmet use. Physical exam Your health care provider will check your: Height and weight. These may be used to calculate your BMI (body mass index). BMI is a measurement that tells if you are at a healthy weight. Waist circumference. This measures the distance around your waistline. This measurement also tells if you are at a healthy weight and may help predict your risk of certain diseases, such as type 2 diabetes and high blood pressure. Heart rate and blood pressure. Body temperature. Skin for abnormal spots. What immunizations do I need?  Vaccines are usually given at various ages, according to a schedule. Your health care provider will recommend vaccines for you based on your age, medical history, and lifestyle or other factors, such as travel or where you work. What tests do I need? Screening Your health care provider may recommend screening tests for certain conditions. This may include: Lipid and cholesterol levels. Diabetes screening. This is done by checking your blood sugar (glucose) after you have not eaten for a while (fasting). Pelvic exam and Pap test. Hepatitis B test. Hepatitis C  test. HIV (human immunodeficiency virus) test. STI (sexually transmitted infection) testing, if you are at risk. Lung cancer screening. Colorectal cancer screening. Mammogram. Talk with your health care provider about when you should start having regular mammograms. This may depend on whether you have a family history of breast cancer. BRCA-related cancer screening. This may be done if you have a family history of breast, ovarian, tubal, or peritoneal cancers. Bone density scan. This is done to screen for osteoporosis. Talk with your health care provider about your test results, treatment options, and if necessary, the need for more tests. Follow these instructions at home: Eating and drinking  Eat a diet that includes fresh fruits and vegetables, whole grains, lean protein, and low-fat dairy products. Take vitamin and mineral supplements as recommended by your health care provider. Do not drink alcohol if: Your health care provider tells you not to drink. You are pregnant, may be pregnant, or are planning to become pregnant. If you drink alcohol: Limit how much you have to 0-1 drink a day. Know how much alcohol is in your drink. In the [place] one drink equals one 12 oz bottle of beer (355 mL), one 5 oz glass of wine (148 mL), or one 1 oz glass of hard liquor (44 mL). Lifestyle Brush your teeth every morning and night with fluoride toothpaste. Floss one time each day. Exercise for at least 30 minutes 5 or more days each week. Do not use any products that contain nicotine or tobacco. These products include cigarettes, chewing tobacco, and vaping devices, such as e-cigarettes. If you need help quitting, ask your health care provider. Do not use drugs. If you are sexually active, practice safe sex. Use a condom or other form of protection to   prevent STIs. If you do not wish to become pregnant, use a form of birth control. If you plan to become pregnant, see your health care provider for a  prepregnancy visit. Take aspirin only as told by your health care provider. Make sure that you understand how much to take and what form to take. Work with your health care provider to find out whether it is safe and beneficial for you to take aspirin daily. Find healthy ways to manage stress, such as: Meditation, yoga, or listening to music. Journaling. Talking to a trusted person. Spending time with friends and family. Minimize exposure to UV radiation to reduce your risk of skin cancer. Safety Always wear your seat belt while driving or riding in a vehicle. Do not drive: If you have been drinking alcohol. Do not ride with someone who has been drinking. When you are tired or distracted. While texting. If you have been using any mind-altering substances or drugs. Wear a helmet and other protective equipment during sports activities. If you have firearms in your house, make sure you follow all gun safety procedures. Seek help if you have been physically or sexually abused. What's next? Visit your health care provider once a year for an annual wellness visit. Ask your health care provider how often you should have your eyes and teeth checked. Stay up to date on all vaccines. This information is not intended to replace advice given to you by your health care provider. Make sure you discuss any questions you have with your health care provider. Document Revised: 04/10/2021 Document Reviewed: 04/10/2021 Elsevier Patient Education  2023 Elsevier Inc.  

## 2023-02-20 NOTE — Assessment & Plan Note (Signed)
Stable

## 2023-02-21 LAB — CBC WITH DIFFERENTIAL/PLATELET
Absolute Monocytes: 543 cells/uL (ref 200–950)
Basophils Absolute: 31 cells/uL (ref 0–200)
Basophils Relative: 0.5 %
Eosinophils Absolute: 153 cells/uL (ref 15–500)
Eosinophils Relative: 2.5 %
HCT: 39 % (ref 35.0–45.0)
Hemoglobin: 12.9 g/dL (ref 11.7–15.5)
Lymphs Abs: 1354 cells/uL (ref 850–3900)
MCH: 32 pg (ref 27.0–33.0)
MCHC: 33.1 g/dL (ref 32.0–36.0)
MCV: 96.8 fL (ref 80.0–100.0)
MPV: 11.7 fL (ref 7.5–12.5)
Monocytes Relative: 8.9 %
Neutro Abs: 4020 cells/uL (ref 1500–7800)
Neutrophils Relative %: 65.9 %
Platelets: 205 10*3/uL (ref 140–400)
RBC: 4.03 10*6/uL (ref 3.80–5.10)
RDW: 12.7 % (ref 11.0–15.0)
Total Lymphocyte: 22.2 %
WBC: 6.1 10*3/uL (ref 3.8–10.8)

## 2023-02-21 LAB — COMPREHENSIVE METABOLIC PANEL
AG Ratio: 1.7 (calc) (ref 1.0–2.5)
ALT: 29 U/L (ref 6–29)
AST: 33 U/L (ref 10–35)
Albumin: 4.5 g/dL (ref 3.6–5.1)
Alkaline phosphatase (APISO): 46 U/L (ref 37–153)
BUN/Creatinine Ratio: 24 (calc) — ABNORMAL HIGH (ref 6–22)
BUN: 26 mg/dL — ABNORMAL HIGH (ref 7–25)
CO2: 21 mmol/L (ref 20–32)
Calcium: 10.2 mg/dL (ref 8.6–10.4)
Chloride: 107 mmol/L (ref 98–110)
Creat: 1.1 mg/dL — ABNORMAL HIGH (ref 0.50–1.05)
Globulin: 2.7 g/dL (calc) (ref 1.9–3.7)
Glucose, Bld: 102 mg/dL — ABNORMAL HIGH (ref 65–99)
Potassium: 3.9 mmol/L (ref 3.5–5.3)
Sodium: 141 mmol/L (ref 135–146)
Total Bilirubin: 0.6 mg/dL (ref 0.2–1.2)
Total Protein: 7.2 g/dL (ref 6.1–8.1)

## 2023-02-21 LAB — LIPID PANEL
Cholesterol: 153 mg/dL (ref <200)
HDL: 40 mg/dL — ABNORMAL LOW (ref >=50)
LDL Cholesterol (Calc): 84 mg/dL (calc)
Non-HDL Cholesterol (Calc): 113 mg/dL (calc) (ref <130)
Total CHOL/HDL Ratio: 3.8 (calc) (ref <5.0)
Triglycerides: 197 mg/dL — ABNORMAL HIGH (ref <150)

## 2023-02-21 LAB — VITAMIN D 25 HYDROXY (VIT D DEFICIENCY, FRACTURES): Vit D, 25-Hydroxy: 62 ng/mL (ref 30–100)

## 2023-02-25 ENCOUNTER — Other Ambulatory Visit: Payer: Self-pay | Admitting: Family Medicine

## 2023-02-25 DIAGNOSIS — R7989 Other specified abnormal findings of blood chemistry: Secondary | ICD-10-CM

## 2023-03-03 DIAGNOSIS — L400 Psoriasis vulgaris: Secondary | ICD-10-CM | POA: Diagnosis not present

## 2023-03-11 ENCOUNTER — Ambulatory Visit (INDEPENDENT_AMBULATORY_CARE_PROVIDER_SITE_OTHER): Payer: Medicare Other | Admitting: *Deleted

## 2023-03-11 DIAGNOSIS — Z0000 Encounter for general adult medical examination without abnormal findings: Secondary | ICD-10-CM | POA: Diagnosis not present

## 2023-03-11 NOTE — Progress Notes (Signed)
Subjective:   Sydney Thompson is a 62 y.o. female who presents for Medicare Annual (Subsequent) preventive examination.  I connected with  Sydney Thompson on 03/11/23 by a audio enabled telemedicine application and verified that I am speaking with the correct person using two identifiers.  Patient Location: Home  Provider Location: Office/Clinic  I discussed the limitations of evaluation and management by telemedicine. The patient expressed understanding and agreed to proceed.   Review of Systems     Cardiac Risk Factors include: advanced age (>60men, >57 women);dyslipidemia     Objective:    There were no vitals filed for this visit. There is no height or weight on file to calculate BMI.     03/11/2023    1:41 PM 01/14/2022    2:03 PM  Advanced Directives  Does Patient Have a Medical Advance Directive? No No  Would patient like information on creating a medical advance directive? No - Patient declined No - Patient declined    Current Medications (verified) Outpatient Encounter Medications as of 03/11/2023  Medication Sig   fenofibrate 160 MG tablet Take 1 tablet (160 mg total) by mouth daily.   gabapentin (NEURONTIN) 300 MG capsule Take 1 capsule (300 mg total) by mouth at bedtime.   lovastatin (MEVACOR) 20 MG tablet Take 1 tablet (20 mg total) by mouth at bedtime.   Multiple Vitamin (MULTI-VITAMIN DAILY PO) Take by mouth.   Omega-3 Fatty Acids (FISH OIL) 1000 MG CAPS Take by mouth.   sertraline (ZOLOFT) 100 MG tablet TAKE 1 AND 1/2 TABLETS BY  MOUTH DAILY   TREMFYA 100 MG/ML SOSY Inject 1 Syringe into the skin every 8 (eight) weeks.   Vitamin D, Ergocalciferol, (DRISDOL) 1.25 MG (50000 UNIT) CAPS capsule Take 1 capsule (50,000 Units total) by mouth every 7 (seven) days.   No facility-administered encounter medications on file as of 03/11/2023.    Allergies (verified) Patient has no known allergies.   History: Past Medical History:  Diagnosis Date   Depression     GERD (gastroesophageal reflux disease)    IBS (irritable bowel syndrome)    Past Surgical History:  Procedure Laterality Date   CHOLECYSTECTOMY     GALLBLADDER SURGERY     LITHOTRIPSY     Dr. 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    Comment: retired  Tobacco Use   Smoking status: Never   Smokeless tobacco: Never  Substance and Sexual Activity   Alcohol use: No   Drug use: No   Sexual activity: Yes    Partners: Female    Birth control/protection: None  Other Topics Concern   Not on file  Social History Narrative   Exercise-- walking some    Social Determinants of Health   Financial Resource Strain: Low Risk  (  01/14/2022)   Overall Financial Resource Strain (CARDIA)    Difficulty of Paying Living Expenses: Not hard at all  Food Insecurity: No Food Insecurity (03/11/2023)   Hunger Vital Sign    Worried About Running Out of Food in the Last Year: Never true    Ran Out of Food in the Last Year: Never true  Transportation Needs: No Transportation Needs (03/11/2023)   PRAPARE - Administrator, Civil Service (Medical): No    Lack of Transportation (Non-Medical): No  Physical Activity: Unknown (01/14/2022)   Exercise Vital Sign    Days of Exercise per Week: 0 days    Minutes of Exercise per Session: Not on file  Stress: No Stress Concern Present (01/14/2022)   [employer] of Occupational Health - Occupational Stress  Questionnaire    Feeling of Stress : Only a little  Social Connections: Moderately Integrated (01/14/2022)   Social Connection and Isolation Panel [NHANES]    Frequency of Communication with Friends and Family: More than three times a week    Frequency of Social Gatherings with Friends and Family: More than three times a week    Attends Religious Services: Never    Database administrator or Organizations: Yes    Attends Engineer, structural: 1 to 4 times per year    Marital Status: Living with partner    Tobacco Counseling Counseling given: Not Answered   Clinical Intake:  Pre-visit preparation completed: Yes  Pain : No/denies pain  Nutritional Risks: None Diabetes: No  How often do you need to have someone help you when you read instructions, pamphlets, or other written materials from your doctor or pharmacy?: 1 - Never   Activities of Daily Living    03/11/2023    1:45 PM  In your present state of health, do you have any difficulty performing the following activities:  Hearing? 1  Comment some slight hearing loss  Vision? 0  Difficulty concentrating or making decisions? 1  Comment remembering things  Walking or climbing stairs? 0  Dressing or bathing? 0  Doing errands, shopping? 0  Preparing Food and eating ? N  Using the Toilet? N  In the past six months, have you accidently leaked urine? N  Do you have problems with loss of bowel control? N  Managing your Medications? N  Managing your Finances? N  Housekeeping or managing your Housekeeping? N    Patient Care Team: Zola Button, Grayling Congress, DO as PCP - General Dunzweiler, Arline Asp, RN as Registered Nurse (Dermatology) Marcellus Scott, MD as Referring Physician (Sleep Medicine) Park Liter, DPM (Inactive) as Consulting Physician (Podiatry) Olegario Shearer, MD as Referring Physician (Dermatology)  Indicate any recent Medical Services you may have received from other than [facility] providers in the past  year (date may be approximate).     Assessment:   This is a routine wellness examination for Sydney Thompson.  Hearing/Vision screen No results found.  Dietary issues and exercise activities discussed: Current Exercise Habits: Home exercise routine, Type of exercise: walking, Time (Minutes): 20, Frequency (Times/Week): 4, Weekly Exercise (Minutes/Week): 80, Intensity: Mild, Exercise limited by: None identified   Goals Addressed   None    Depression Screen    03/11/2023    1:44 PM 02/20/2023    2:38 PM 02/20/2023    2:37 PM 02/18/2022    1:17 PM 01/14/2022    2:04 PM 08/19/2021   10:37 AM 02/12/2021    1:15 PM  PHQ 2/9 Scores  PHQ - 2 Score 0 0 0 0 1 1 0  PHQ- 9 Score  0     0    Fall Risk    03/11/2023    1:42 PM 02/20/2023    2:37 PM 02/18/2022    1:17 PM 01/14/2022    1:47 PM 08/19/2021   10:36 AM  Fall Risk   Falls in the past year? 0 0 0 1 0  Number falls in past yr: 0 0 0 0 0  Injury with Fall? 0 0 0 0 0  Risk for fall due to : No Fall Risks   History of fall(s);Orthopedic patient Impaired balance/gait  Follow up Falls evaluation completed Falls evaluation completed Falls evaluation completed Education provided;Falls prevention discussed     FALL RISK PREVENTION PERTAINING TO THE HOME:  Any stairs in or around the home? Yes  If so, are there any without handrails? No  Home free of loose throw rugs in walkways, pet beds, electrical cords, etc? Yes  Adequate lighting in your home to reduce risk of falls? Yes   ASSISTIVE DEVICES UTILIZED TO PREVENT FALLS:  Life alert? No  Use of a cane, walker or w/c? No  Grab bars in the bathroom?  In the shower Shower chair or bench in shower? Yes  Elevated toilet seat or a handicapped toilet? No   TIMED UP AND GO:  Was the test performed?  No, audio visit .    Cognitive Function:        03/11/2023    1:48 PM  6CIT Screen  What Year? 0 points  What month? 0 points  What time? 0 points  Count back from 20 0 points  Months  in reverse 0 points  Repeat phrase 0 points  Total Score 0 points    Immunizations Immunization History  Administered Date(s) Administered   Hepatitis A 02/20/2010   Hepatitis A, Adult 07/27/2014   Influenza Split 08/18/2012, 08/31/2015   Influenza Whole 08/08/2009, 02/20/2010   Influenza,inj,Quad PF,6+ Mos 10/29/2013, 07/27/2014, 08/10/2020, 08/21/2022   Moderna SARS-COV2 Booster Vaccination 02/12/2021   Moderna Sars-Covid-2 Vaccination 02/02/2020, 03/01/2020, 09/25/2020   PPD Test 10/29/2013, 10/23/2014   Td 02/20/2010    TDAP status: Due, Education has been provided regarding the importance of this vaccine. Advised may receive this vaccine at local pharmacy or Health Dept. Aware to provide a copy of the vaccination record if obtained from local pharmacy or Health Dept. Verbalized acceptance and understanding.  Flu Vaccine status: Up to date  Covid-19 vaccine status: Information provided on how to obtain vaccines.   Qualifies for Shingles Vaccine? Yes   Zostavax completed No   Shingrix Completed?: No.    Education has been provided regarding the importance of this vaccine. Patient has been advised to call insurance company to determine out of pocket expense if they have not yet received this vaccine. Advised may also receive vaccine at local pharmacy or Health Dept. Verbalized acceptance and understanding.  Screening Tests Health Maintenance  Topic Date Due   Zoster Vaccines- Shingrix (1 of 2) Never done   PAP SMEAR-Modifier  11/17/2014   DTaP/Tdap/Td (2 - Tdap) 02/21/2020   COVID-19 Vaccine (5 - 2023-24 season) 06/27/2022   Medicare Annual Wellness (AWV)  01/15/2023   Hepatitis C Screening  08/16/2024 (Originally 11/06/1978)   HIV Screening  08/16/2024 (Originally 11/06/1975)   INFLUENZA VACCINE  05/28/2023   MAMMOGRAM  12/18/2023   COLONOSCOPY (Pts 45-70yrs Insurance coverage will need to be confirmed)  12/27/2030  HPV VACCINES  Aged Out    Health Maintenance  Health  Maintenance Due  Topic Date Due   Zoster Vaccines- Shingrix (1 of 2) Never done   PAP SMEAR-Modifier  11/17/2014   DTaP/Tdap/Td (2 - Tdap) 02/21/2020   COVID-19 Vaccine (5 - 2023-24 season) 06/27/2022   Medicare Annual Wellness (AWV)  01/15/2023    Colorectal cancer screening: Type of screening: Colonoscopy. Completed 12/26/20. Repeat every 10 years  Mammogram status: Completed 12/17/22. Repeat every year  Lung Cancer Screening: (Low Dose CT Chest recommended if Age 77-80 years, 30 pack-year currently smoking OR have quit w/in 15years.) does not qualify.   Additional Screening:  Hepatitis C Screening: does qualify; Completed N/a  Vision Screening: Recommended annual ophthalmology exams for early detection of glaucoma and other disorders of the eye. Is the patient up to date with their annual eye exam?  Yes  Who is the provider or what is the name of the office in which the patient attends annual eye exams? Doesn't remember name at this time If pt is not established with a provider, would they like to be referred to a provider to establish care? No .   Dental Screening: Recommended annual dental exams for proper oral hygiene  Community Resource Referral / Chronic Care Management: CRR required this visit?  No   CCM required this visit?  No      Plan:     I have personally reviewed and noted the following in the patient's chart:   Medical and social history Use of alcohol, tobacco or illicit drugs  Current medications and supplements including opioid prescriptions. Patient is not currently taking opioid prescriptions. Functional ability and status Nutritional status Physical activity Advanced directives List of other physicians Hospitalizations, surgeries, and ER visits in previous 12 months Vitals Screenings to include cognitive, depression, and falls Referrals and appointments  In addition, I have reviewed and discussed with patient certain preventive protocols, quality  metrics, and best practice recommendations. A written personalized care plan for preventive services as well as general preventive health recommendations were provided to patient.   Due to this being a telephonic visit, the after visit summary with patients personalized plan was offered to patient via mail or my-chart. Patient declined at this time.  Donne Anon, [place]   03/11/2023   Nurse Notes: None

## 2023-03-11 NOTE — Patient Instructions (Signed)
Sydney Thompson , Thank you for taking time to come for your Medicare Wellness Visit. I appreciate your ongoing commitment to your health goals. Please review the following plan we discussed and let me know if I can assist you in the future.     This is a list of the screening recommended for you and due dates:  Health Maintenance  Topic Date Due   Zoster (Shingles) Vaccine (1 of 2) Never done   Pap Smear  11/17/2014   DTaP/Tdap/Td vaccine (2 - Tdap) 02/21/2020   COVID-19 Vaccine (5 - 2023-24 season) 06/27/2022   Hepatitis C Screening: USPSTF Recommendation to screen - Ages 18-79 yo.  08/16/2024*   HIV Screening  08/16/2024*   Flu Shot  05/28/2023   Mammogram  12/18/2023   Medicare Annual Wellness Visit  03/10/2024   Colon Cancer Screening  12/27/2030   HPV Vaccine  Aged Out  *Topic was postponed. The date shown is not the original due date.     Next appointment: Follow up in one year for your annual wellness visit.   Preventive Care 40-64 Years, Female Preventive care refers to lifestyle choices and visits with your health care provider that can promote health and wellness. What does preventive care include? A yearly physical exam. This is also called an annual well check. Dental exams once or twice a year. Routine eye exams. Ask your health care provider how often you should have your eyes checked. Personal lifestyle choices, including: Daily care of your teeth and gums. Regular physical activity. Eating a healthy diet. Avoiding tobacco and drug use. Limiting alcohol use. Practicing safe sex. Taking low-dose aspirin daily starting at age 86. Taking vitamin and mineral supplements as recommended by your health care provider. What happens during an annual well check? The services and screenings done by your health care provider during your annual well check will depend on your age, overall health, lifestyle risk factors, and family history of disease. Counseling  Your health  care provider may ask you questions about your: Alcohol use. Tobacco use. Drug use. Emotional well-being. Home and relationship well-being. Sexual activity. Eating habits. Work and work Astronomer. Method of birth control. Menstrual cycle. Pregnancy history. Screening  You may have the following tests or measurements: Height, weight, and BMI. Blood pressure. Lipid and cholesterol levels. These may be checked every 5 years, or more frequently if you are over 64 years old. Skin check. Lung cancer screening. You may have this screening every year starting at age 67 if you have a 30-pack-year history of smoking and currently smoke or have quit within the past 15 years. Fecal occult blood test (FOBT) of the stool. You may have this test every year starting at age 11. Flexible sigmoidoscopy or colonoscopy. You may have a sigmoidoscopy every 5 years or a colonoscopy every 10 years starting at age 35. Hepatitis C blood test. Hepatitis B blood test. Sexually transmitted disease (STD) testing. Diabetes screening. This is done by checking your blood sugar (glucose) after you have not eaten for a while (fasting). You may have this done every 1-3 years. Mammogram. This may be done every 1-2 years. Talk to your health care provider about when you should start having regular mammograms. This may depend on whether you have a family history of breast cancer. BRCA-related cancer screening. This may be done if you have a family history of breast, ovarian, tubal, or peritoneal cancers. Pelvic exam and Pap test. This may be done every 3 years starting at age  21. Starting at age 27, this may be done every 5 years if you have a Pap test in combination with an HPV test. Bone density scan. This is done to screen for osteoporosis. You may have this scan if you are at high risk for osteoporosis. Discuss your test results, treatment options, and if necessary, the need for more tests with your health care  provider. Vaccines  Your health care provider may recommend certain vaccines, such as: Influenza vaccine. This is recommended every year. Tetanus, diphtheria, and acellular pertussis (Tdap, Td) vaccine. You may need a Td booster every 10 years. Zoster vaccine. You may need this after age 54. Pneumococcal 13-valent conjugate (PCV13) vaccine. You may need this if you have certain conditions and were not previously vaccinated. Pneumococcal polysaccharide (PPSV23) vaccine. You may need one or two doses if you smoke cigarettes or if you have certain conditions. Talk to your health care provider about which screenings and vaccines you need and how often you need them. This information is not intended to replace advice given to you by your health care provider. Make sure you discuss any questions you have with your health care provider. Document Released: 11/09/2015 Document Revised: 07/02/2016 Document Reviewed: 08/14/2015 Elsevier Interactive Patient Education  2017 [employer].    Fall Prevention in the Home Falls can cause injuries. They can happen to people of all ages. There are many things you can do to make your home safe and to help prevent falls. What can I do on the outside of my home? Regularly fix the edges of walkways and driveways and fix any cracks. Remove anything that might make you trip as you walk through a door, such as a raised step or threshold. Trim any bushes or trees on the path to your home. Use bright outdoor lighting. Clear any walking paths of anything that might make someone trip, such as rocks or tools. Regularly check to see if handrails are loose or broken. Make sure that both sides of any steps have handrails. Any raised decks and porches should have guardrails on the edges. Have any leaves, snow, or ice cleared regularly. Use sand or salt on walking paths during winter. Clean up any spills in your garage right away. This includes oil or grease spills. What  can I do in the bathroom? Use night lights. Install grab bars by the toilet and in the tub and shower. Do not use towel bars as grab bars. Use non-skid mats or decals in the tub or shower. If you need to sit down in the shower, use a plastic, non-slip stool. Keep the floor dry. Clean up any water that spills on the floor as soon as it happens. Remove soap buildup in the tub or shower regularly. Attach bath mats securely with double-sided non-slip rug tape. Do not have throw rugs and other things on the floor that can make you trip. What can I do in the bedroom? Use night lights. Make sure that you have a light by your bed that is easy to reach. Do not use any sheets or blankets that are too big for your bed. They should not hang down onto the floor. Have a firm chair that has side arms. You can use this for support while you get dressed. Do not have throw rugs and other things on the floor that can make you trip. What can I do in the kitchen? Clean up any spills right away. Avoid walking on wet floors. Keep items that you  use a lot in easy-to-reach places. If you need to reach something above you, use a strong step stool that has a grab bar. Keep electrical cords out of the way. Do not use floor polish or wax that makes floors slippery. If you must use wax, use non-skid floor wax. Do not have throw rugs and other things on the floor that can make you trip. What can I do with my stairs? Do not leave any items on the stairs. Make sure that there are handrails on both sides of the stairs and use them. Fix handrails that are broken or loose. Make sure that handrails are as long as the stairways. Check any carpeting to make sure that it is firmly attached to the stairs. Fix any carpet that is loose or worn. Avoid having throw rugs at the top or bottom of the stairs. If you do have throw rugs, attach them to the floor with carpet tape. Make sure that you have a light switch at the top of the  stairs and the bottom of the stairs. If you do not have them, ask someone to add them for you. What else can I do to help prevent falls? Wear shoes that: Do not have high heels. Have rubber bottoms. Are comfortable and fit you well. Are closed at the toe. Do not wear sandals. If you use a stepladder: Make sure that it is fully opened. Do not climb a closed stepladder. Make sure that both sides of the stepladder are locked into place. Ask someone to hold it for you, if possible. Clearly mark and make sure that you can see: Any grab bars or handrails. First and last steps. Where the edge of each step is. Use tools that help you move around (mobility aids) if they are needed. These include: Canes. Walkers. Scooters. Crutches. Turn on the lights when you go into a dark area. Replace any light bulbs as soon as they burn out. Set up your furniture so you have a clear path. Avoid moving your furniture around. If any of your floors are uneven, fix them. If there are any pets around you, be aware of where they are. Review your medicines with your doctor. Some medicines can make you feel dizzy. This can increase your chance of falling. Ask your doctor what other things that you can do to help prevent falls. This information is not intended to replace advice given to you by your health care provider. Make sure you discuss any questions you have with your health care provider. Document Released: 08/09/2009 Document Revised: 03/20/2016 Document Reviewed: 11/17/2014 Elsevier Interactive Patient Education  2017 [employer].

## 2023-04-10 ENCOUNTER — Other Ambulatory Visit (INDEPENDENT_AMBULATORY_CARE_PROVIDER_SITE_OTHER): Payer: Medicare Other

## 2023-04-10 DIAGNOSIS — R7989 Other specified abnormal findings of blood chemistry: Secondary | ICD-10-CM

## 2023-04-10 NOTE — Addendum Note (Signed)
Addended by: Rosita Kea on: 04/10/2023 01:52 PM   Modules accepted: Orders

## 2023-04-11 LAB — COMPREHENSIVE METABOLIC PANEL
AG Ratio: 1.4 (calc) (ref 1.0–2.5)
ALT: 32 U/L — ABNORMAL HIGH (ref 6–29)
AST: 39 U/L — ABNORMAL HIGH (ref 10–35)
Albumin: 4.2 g/dL (ref 3.6–5.1)
Alkaline phosphatase (APISO): 46 U/L (ref 37–153)
BUN/Creatinine Ratio: 31 (calc) — ABNORMAL HIGH (ref 6–22)
BUN: 26 mg/dL — ABNORMAL HIGH (ref 7–25)
CO2: 21 mmol/L (ref 20–32)
Calcium: 9.6 mg/dL (ref 8.6–10.4)
Chloride: 103 mmol/L (ref 98–110)
Creat: 0.84 mg/dL (ref 0.50–1.05)
Globulin: 3 g/dL (calc) (ref 1.9–3.7)
Glucose, Bld: 84 mg/dL (ref 65–99)
Potassium: 4.6 mmol/L (ref 3.5–5.3)
Sodium: 138 mmol/L (ref 135–146)
Total Bilirubin: 0.4 mg/dL (ref 0.2–1.2)
Total Protein: 7.2 g/dL (ref 6.1–8.1)

## 2023-04-17 ENCOUNTER — Telehealth: Payer: Self-pay | Admitting: Neurology

## 2023-04-17 NOTE — Telephone Encounter (Signed)
Patient called back for lab results. Made aware of information below:    Donato Schultz, DO 04/15/2023 11:57 AM EDT     Liver function only slightly elevated ---- avoid tylenol/ alcohol

## 2023-05-04 DIAGNOSIS — L400 Psoriasis vulgaris: Secondary | ICD-10-CM | POA: Diagnosis not present

## 2023-05-20 DIAGNOSIS — G4733 Obstructive sleep apnea (adult) (pediatric): Secondary | ICD-10-CM | POA: Diagnosis not present

## 2023-05-28 ENCOUNTER — Other Ambulatory Visit: Payer: Self-pay | Admitting: Family Medicine

## 2023-05-28 DIAGNOSIS — E559 Vitamin D deficiency, unspecified: Secondary | ICD-10-CM

## 2023-07-06 DIAGNOSIS — L400 Psoriasis vulgaris: Secondary | ICD-10-CM | POA: Diagnosis not present

## 2023-07-14 DIAGNOSIS — L400 Psoriasis vulgaris: Secondary | ICD-10-CM | POA: Diagnosis not present

## 2023-07-14 DIAGNOSIS — L4050 Arthropathic psoriasis, unspecified: Secondary | ICD-10-CM | POA: Diagnosis not present

## 2023-08-19 DIAGNOSIS — G4733 Obstructive sleep apnea (adult) (pediatric): Secondary | ICD-10-CM | POA: Diagnosis not present

## 2023-08-19 DIAGNOSIS — R400 Somnolence: Secondary | ICD-10-CM | POA: Diagnosis not present

## 2023-08-19 DIAGNOSIS — G4761 Periodic limb movement disorder: Secondary | ICD-10-CM | POA: Diagnosis not present

## 2023-08-23 ENCOUNTER — Other Ambulatory Visit: Payer: Self-pay | Admitting: Family Medicine

## 2023-08-23 DIAGNOSIS — E559 Vitamin D deficiency, unspecified: Secondary | ICD-10-CM

## 2023-08-24 ENCOUNTER — Ambulatory Visit (INDEPENDENT_AMBULATORY_CARE_PROVIDER_SITE_OTHER): Payer: Medicare Other | Admitting: Family Medicine

## 2023-08-24 ENCOUNTER — Encounter: Payer: Self-pay | Admitting: Family Medicine

## 2023-08-24 VITALS — BP 104/80 | HR 74 | Temp 97.8°F | Resp 18 | Ht 62.0 in | Wt 223.0 lb

## 2023-08-24 DIAGNOSIS — M79641 Pain in right hand: Secondary | ICD-10-CM | POA: Diagnosis not present

## 2023-08-24 DIAGNOSIS — E559 Vitamin D deficiency, unspecified: Secondary | ICD-10-CM

## 2023-08-24 DIAGNOSIS — F325 Major depressive disorder, single episode, in full remission: Secondary | ICD-10-CM

## 2023-08-24 DIAGNOSIS — E782 Mixed hyperlipidemia: Secondary | ICD-10-CM

## 2023-08-24 DIAGNOSIS — Z23 Encounter for immunization: Secondary | ICD-10-CM | POA: Diagnosis not present

## 2023-08-24 DIAGNOSIS — E785 Hyperlipidemia, unspecified: Secondary | ICD-10-CM | POA: Diagnosis not present

## 2023-08-24 DIAGNOSIS — M79642 Pain in left hand: Secondary | ICD-10-CM

## 2023-08-24 DIAGNOSIS — F320 Major depressive disorder, single episode, mild: Secondary | ICD-10-CM

## 2023-08-24 MED ORDER — FENOFIBRATE 160 MG PO TABS: 160.0000 mg | ORAL_TABLET | Freq: Every day | ORAL | 1 refills | Status: DC

## 2023-08-24 MED ORDER — LOVASTATIN 20 MG PO TABS: 20.0000 mg | ORAL_TABLET | Freq: Every day | ORAL | 1 refills | Status: DC

## 2023-08-24 MED ORDER — GABAPENTIN 300 MG PO CAPS: 300.0000 mg | ORAL_CAPSULE | Freq: Every day | ORAL | 3 refills | Status: DC

## 2023-08-24 MED ORDER — SERTRALINE HCL 100 MG PO TABS: ORAL_TABLET | ORAL | 1 refills | Status: DC

## 2023-08-24 NOTE — Progress Notes (Signed)
Established Patient Office Visit  Subjective   Patient ID: Sydney Thompson, female    DOB: 16-Jan-1961  Age: 62 y.o. MRN: [id number]  Chief Complaint  Patient presents with   Hyperlipidemia   Depression   Follow-up    HPI History of Present Illness   The patient, with a history of hyperlipidemia, neuropathy, depression, and vitamin D deficiency, presents for medication refills. She receives her medications from [facility], including fenofibrate, gabapentin, lovastatin, sertraline, and vitamin D. She also requests a flu shot, as she has not received one this year.  The patient reports a new complaint of ankle pain, localized to the lateral malleolus. The pain is worse in the morning and with walking. She has a history of an ankle sprain, and she suspects arthritis. She has been managing the pain with topical treatments, which provide some relief. Discussed the use of AI scribe software for clinical note transcription with the patient, who gave verbal consent to proceed.  History of Present Illness   The patient, with a history of hyperlipidemia, neuropathy, depression, and vitamin D deficiency, presents for medication refills. She receives her medications from [facility], including fenofibrate, gabapentin, lovastatin, sertraline, and vitamin D. She also requests a flu shot, as she has not received one this year.  The patient reports a new complaint of ankle pain, localized to the lateral malleolus. The pain is worse in the morning and with walking. She has a history of an ankle sprain, and she suspects arthritis. She has been managing the pain with topical treatments, which provide some relief.          Patient Active Problem List   Diagnosis Date Noted   Vitamin D deficiency 08/19/2021   Internal hemorrhoid 08/19/2021   Itching of ear 05/07/2020   Dizziness 05/07/2020   Bilateral hand pain 05/07/2020   Cervical arthritis 11/18/2018   Chronic bilateral low back pain without sciatica  11/18/2018   Preventative health care 01/19/2017   OSA (obstructive sleep apnea) 04/29/2011   CARPAL TUNNEL SYNDROME, BILATERAL 11/26/2010   Hyperlipidemia 11/05/2010   Enlarged lymph nodes 11/05/2010   LIPOMA OF OTHER SKIN AND SUBCUTANEOUS TISSUE 02/20/2010   ABDOMINAL BLOATING 02/20/2010   Asymptomatic postmenopausal status 02/20/2010   Depression, major, single episode, complete remission (HCC) 10/17/2006   GERD 10/17/2006   IRRITABLE BOWEL SYNDROME 10/17/2006   Past Medical History:  Diagnosis Date   Depression    GERD (gastroesophageal reflux disease)    IBS (irritable bowel syndrome)    Past Surgical History:  Procedure Laterality Date   CHOLECYSTECTOMY     GALLBLADDER SURGERY     LITHOTRIPSY     Dr. 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    Comment: retired  Tobacco Use   Smoking status: Never   Smokeless tobacco: Never  Substance and Sexual Activity   Alcohol use: No   Drug use: No   Sexual activity: Yes    Partners: Female    Birth control/protection: None  Other Topics Concern   Not  on file  Social History Narrative   Exercise-- walking some    Social Determinants of Health   Financial Resource Strain: Low Risk  (01/14/2022)   Overall Financial Resource Strain (CARDIA)    Difficulty of Paying Living Expenses: Not hard at all  Food Insecurity: No Food Insecurity (03/11/2023)   Hunger Vital Sign    Worried About Running Out of Food in the Last Year: Never true    Ran Out of Food in the Last Year: Never true  Transportation Needs: No Transportation Needs  (03/11/2023)   PRAPARE - Administrator, Civil Service (Medical): No    Lack of Transportation (Non-Medical): No  Physical Activity: Unknown (01/14/2022)   Exercise Vital Sign    Days of Exercise per Week: 0 days    Minutes of Exercise per Session: Not on file  Stress: No Stress Concern Present (01/14/2022)   [employer] of Occupational Health - Occupational Stress Questionnaire    Feeling of Stress : Only a little  Social Connections: Moderately Integrated (01/14/2022)   Social Connection and Isolation Panel [NHANES]    Frequency of Communication with Friends and Family: More than three times a week    Frequency of Social Gatherings with Friends and Family: More than three times a week    Attends Religious Services: Never    Database administrator or Organizations: Yes    Attends Banker Meetings: 1 to 4 times per year    Marital Status: Living with partner  Intimate Partner Violence: Not At Risk (03/11/2023)   Humiliation, Afraid, Rape, and Kick questionnaire    Fear of Current or Ex-Partner: No    Emotionally Abused: No    Physically Abused: No    Sexually Abused: No   Family Status  Relation Name Status   Mother  Deceased at age 11   Father  Deceased at age 29       MI/ stroke   Sister  Alive   MGM  Deceased   MGF  Deceased   PGM  Deceased   PGF  Deceased   Nutritional therapist  (Not Specified)   Oneal Grout  (Not Specified)   Oneal Grout  (Not Specified)  No partnership data on file   Family History  Problem Relation Age of Onset   Cancer Mother        breast   Alzheimer's disease Mother    Stroke Father    Heart disease Father        MI   Hyperlipidemia Father    Diabetes Father    Diabetes Paternal Uncle    Diabetes Paternal Uncle    Diabetes Paternal Uncle    No Known Allergies  Review of Systems  Constitutional:  Negative for chills, fever and malaise/fatigue.  HENT:  Negative for congestion and hearing loss.   Eyes:  Negative for blurred  vision and discharge.  Respiratory:  Negative for cough, sputum production and shortness of breath.   Cardiovascular:  Negative for chest pain, palpitations and leg swelling.  Gastrointestinal:  Negative for abdominal pain, blood in stool, constipation, diarrhea, heartburn, nausea and vomiting.  Genitourinary:  Negative for dysuria, frequency, hematuria and urgency.  Musculoskeletal:  Negative for back pain, falls and myalgias.  Skin:  Negative for rash.  Neurological:  Negative for dizziness, sensory change, loss of consciousness, weakness and headaches.  Endo/Heme/Allergies:  Negative for environmental allergies. Does not bruise/bleed easily.  Psychiatric/Behavioral:  Negative for depression and suicidal ideas. The  patient is not nervous/anxious and does not have insomnia.       Objective:     BP 104/80 (BP Location: Left Arm, Patient Position: Sitting, Cuff Size: Large)   Pulse 74   Temp 97.8 F (36.6 C) (Oral)   Resp 18   Ht 5\' 2"  (1.575 m)   Wt 223 lb (101.2 kg)   SpO2 98%   BMI 40.79 kg/m  BP Readings from Last 3 Encounters:  08/24/23 104/80  02/20/23 100/70  08/21/22 116/70   Wt Readings from Last 3 Encounters:  08/24/23 223 lb (101.2 kg)  02/20/23 221 lb 12.8 oz (100.6 kg)  08/21/22 240 lb 6.4 oz (109 kg)   SpO2 Readings from Last 3 Encounters:  08/24/23 98%  02/20/23 97%  08/21/22 95%      Physical Exam Vitals and nursing note reviewed.  Constitutional:      General: She is not in acute distress.    Appearance: Normal appearance. She is well-developed.  HENT:     Head: Normocephalic and atraumatic.  Eyes:     General: No scleral icterus.       Right eye: No discharge.        Left eye: No discharge.  Cardiovascular:     Rate and Rhythm: Normal rate and regular rhythm.     Heart sounds: No murmur heard. Pulmonary:     Effort: Pulmonary effort is normal. No respiratory distress.     Breath sounds: Normal breath sounds.  Musculoskeletal:         General: Normal range of motion.     Cervical back: Normal range of motion and neck supple.     Right lower leg: No edema.     Left lower leg: No edema.  Skin:    General: Skin is warm and dry.  Neurological:     Mental Status: She is alert and oriented to person, place, and time.  Psychiatric:        Mood and Affect: Mood normal.        Behavior: Behavior normal.        Thought Content: Thought content normal.        Judgment: Judgment normal.      No results found for any visits on 08/24/23.  Last CBC Lab Results  Component Value Date   WBC 6.1 02/20/2023   HGB 12.9 02/20/2023   HCT 39.0 02/20/2023   MCV 96.8 02/20/2023   MCH 32.0 02/20/2023   RDW 12.7 02/20/2023   PLT 205 02/20/2023   Last metabolic panel Lab Results  Component Value Date   GLUCOSE 84 04/10/2023   NA 138 04/10/2023   K 4.6 04/10/2023   CL 103 04/10/2023   CO2 21 04/10/2023   BUN 26 (H) 04/10/2023   CREATININE 0.84 04/10/2023   GFR 73.24 08/21/2022   CALCIUM 9.6 04/10/2023   PROT 7.2 04/10/2023   ALBUMIN 4.4 08/21/2022   BILITOT 0.4 04/10/2023   ALKPHOS 41 08/21/2022   AST 39 (H) 04/10/2023   ALT 32 (H) 04/10/2023   Last lipids Lab Results  Component Value Date   CHOL 153 02/20/2023   HDL 40 (L) 02/20/2023   LDLCALC 84 02/20/2023   LDLDIRECT 89.0 08/21/2022   TRIG 197 (H) 02/20/2023   CHOLHDL 3.8 02/20/2023   Last hemoglobin A1c Lab Results  Component Value Date   HGBA1C 5.9 08/16/2018   Last thyroid functions Lab Results  Component Value Date   TSH 4.94 08/19/2021   Last vitamin  D Lab Results  Component Value Date   VD25OH 62 02/20/2023   Last vitamin B12 and Folate Lab Results  Component Value Date   VITAMINB12 374 08/19/2021      The 10-year ASCVD risk score (Arnett DK, et al., 2019) is: 5.8%    Assessment & Plan:   Problem List Items Addressed This Visit       Unprioritized   Bilateral hand pain   Relevant Medications   gabapentin (NEURONTIN) 300 MG  capsule   Vitamin D deficiency - Primary   Relevant Orders   VITAMIN D 25 Hydroxy (Vit-D Deficiency, Fractures)   Hyperlipidemia    Tolerating statin, encouraged heart healthy diet, avoid trans fats, minimize simple carbs and saturated fats. Increase exercise as tolerated       Relevant Medications   fenofibrate 160 MG tablet   lovastatin (MEVACOR) 20 MG tablet   Other Relevant Orders   CBC with Differential/Platelet   Comprehensive metabolic panel   Lipid panel   TSH   Depression, major, single episode, complete remission (HCC)    STABLE  CON'T ZOLOFT      Relevant Medications   sertraline (ZOLOFT) 100 MG tablet   Other Visit Diagnoses     Depression, major, single episode, mild (HCC)       Relevant Medications   sertraline (ZOLOFT) 100 MG tablet   Need for influenza vaccination       Relevant Orders   Flu vaccine trivalent PF, 6mos and older(Flulaval,Afluria,Fluarix,Fluzone) (Completed)     Assessment and Plan    Ankle Pain Morning pain and pain with walking in the right ankle. No recent injury. History of previous ankle sprain. Possible arthritis. Topical treatments provide some relief. -Try over-the-counter Tylenol Arthritis as needed for pain, adhering to the recommended dosage on the bottle.  Medication Refills All medications due for refill. Currently on fenofibrate, gabapentin, lovastatin, sertraline, and vitamin D. -Refill all medications through [facility].  Immunizations No recent flu shot. Tetanus due but will need to be administered at the pharmacy due to Medicare Part D coverage. -Administer flu shot today. -Recommend obtaining tetanus shot at the pharmacy.  General Health Maintenance -Continue current exercise regimen of walking as tolerated. -Complete paperwork for renewal of spouse's handicap placard.      4  No follow-ups on file.    Donato Schultz, DO

## 2023-08-24 NOTE — Assessment & Plan Note (Signed)
Tolerating statin, encouraged heart healthy diet, avoid trans fats, minimize simple carbs and saturated fats. Increase exercise as tolerated 

## 2023-08-24 NOTE — Assessment & Plan Note (Signed)
STABLE  CON'T ZOLOFT

## 2023-08-25 LAB — CBC WITH DIFFERENTIAL/PLATELET
Basophils Absolute: 0.1 10*3/uL (ref 0.0–0.1)
Basophils Relative: 1.2 % (ref 0.0–3.0)
Eosinophils Absolute: 0.2 10*3/uL (ref 0.0–0.7)
Eosinophils Relative: 3.2 % (ref 0.0–5.0)
HCT: 39.3 % (ref 36.0–46.0)
Hemoglobin: 12.7 g/dL (ref 12.0–15.0)
Lymphocytes Relative: 23.1 % (ref 12.0–46.0)
Lymphs Abs: 1.4 10*3/uL (ref 0.7–4.0)
MCHC: 32.2 g/dL (ref 30.0–36.0)
MCV: 97.7 fL (ref 78.0–100.0)
Monocytes Absolute: 0.5 10*3/uL (ref 0.1–1.0)
Monocytes Relative: 9 % (ref 3.0–12.0)
Neutro Abs: 3.8 10*3/uL (ref 1.4–7.7)
Neutrophils Relative %: 63.5 % (ref 43.0–77.0)
Platelets: 194 10*3/uL (ref 150.0–400.0)
RBC: 4.03 Mil/uL (ref 3.87–5.11)
RDW: 14.3 % (ref 11.5–15.5)
WBC: 6.1 10*3/uL (ref 4.0–10.5)

## 2023-08-25 LAB — LIPID PANEL
Cholesterol: 142 mg/dL (ref 0–200)
HDL: 36.7 mg/dL — ABNORMAL LOW (ref >39.00)
LDL Cholesterol: 70 mg/dL (ref 0–99)
NonHDL: 105.68
Total CHOL/HDL Ratio: 4
Triglycerides: 177 mg/dL — ABNORMAL HIGH (ref 0.0–149.0)
VLDL: 35.4 mg/dL (ref 0.0–40.0)

## 2023-08-25 LAB — COMPREHENSIVE METABOLIC PANEL
ALT: 23 U/L (ref 0–35)
AST: 28 U/L (ref 0–37)
Albumin: 4.2 g/dL (ref 3.5–5.2)
Alkaline Phosphatase: 37 U/L — ABNORMAL LOW (ref 39–117)
BUN: 25 mg/dL — ABNORMAL HIGH (ref 6–23)
CO2: 23 meq/L (ref 19–32)
Calcium: 9.6 mg/dL (ref 8.4–10.5)
Chloride: 104 meq/L (ref 96–112)
Creatinine, Ser: 0.96 mg/dL (ref 0.40–1.20)
GFR: 62.84 mL/min (ref >60.00)
Glucose, Bld: 84 mg/dL (ref 70–99)
Potassium: 4.1 meq/L (ref 3.5–5.1)
Sodium: 138 meq/L (ref 135–145)
Total Bilirubin: 0.5 mg/dL (ref 0.2–1.2)
Total Protein: 6.9 g/dL (ref 6.0–8.3)

## 2023-08-25 LAB — TSH: TSH: 4.12 u[IU]/mL (ref 0.35–5.50)

## 2023-08-25 LAB — VITAMIN D 25 HYDROXY (VIT D DEFICIENCY, FRACTURES): VITD: 55.94 ng/mL (ref 30.00–100.00)

## 2023-08-31 DIAGNOSIS — L400 Psoriasis vulgaris: Secondary | ICD-10-CM | POA: Diagnosis not present

## 2023-09-11 ENCOUNTER — Telehealth: Payer: Self-pay | Admitting: Family Medicine

## 2023-09-11 NOTE — Telephone Encounter (Signed)
Pt called to request a copy of lab results from last visit to be mailed to home address.

## 2023-09-14 NOTE — Telephone Encounter (Signed)
Labs placed in mail

## 2023-11-06 DIAGNOSIS — L400 Psoriasis vulgaris: Secondary | ICD-10-CM | POA: Diagnosis not present

## 2023-11-11 ENCOUNTER — Other Ambulatory Visit: Payer: Self-pay | Admitting: Family Medicine

## 2023-11-11 DIAGNOSIS — E785 Hyperlipidemia, unspecified: Secondary | ICD-10-CM

## 2024-01-06 DIAGNOSIS — Z803 Family history of malignant neoplasm of breast: Secondary | ICD-10-CM | POA: Diagnosis not present

## 2024-01-06 DIAGNOSIS — Z1231 Encounter for screening mammogram for malignant neoplasm of breast: Secondary | ICD-10-CM | POA: Diagnosis not present

## 2024-01-06 LAB — HM MAMMOGRAPHY

## 2024-01-11 ENCOUNTER — Encounter: Payer: Self-pay | Admitting: Family Medicine

## 2024-01-12 DIAGNOSIS — L4050 Arthropathic psoriasis, unspecified: Secondary | ICD-10-CM | POA: Diagnosis not present

## 2024-01-12 DIAGNOSIS — L400 Psoriasis vulgaris: Secondary | ICD-10-CM | POA: Diagnosis not present

## 2024-02-22 ENCOUNTER — Telehealth: Payer: Self-pay | Admitting: Family Medicine

## 2024-02-22 NOTE — Telephone Encounter (Signed)
 Copied from CRM [id number]. Topic: Medicare AWV >> Feb 22, 2024 11:12 AM Juliana Ocean wrote: Reason for CRM: LVM 02/22/2024 to schedule AWV. Please schedule Virtual or Telehealth visits ONLY.   Rosalee Collins; Care Guide Ambulatory Clinical Support [facility] Direct Dial: [phone]

## 2024-03-04 DIAGNOSIS — L400 Psoriasis vulgaris: Secondary | ICD-10-CM | POA: Diagnosis not present

## 2024-03-10 DIAGNOSIS — G4761 Periodic limb movement disorder: Secondary | ICD-10-CM | POA: Diagnosis not present

## 2024-03-10 DIAGNOSIS — G4733 Obstructive sleep apnea (adult) (pediatric): Secondary | ICD-10-CM | POA: Diagnosis not present

## 2024-03-10 DIAGNOSIS — R400 Somnolence: Secondary | ICD-10-CM | POA: Diagnosis not present

## 2024-03-19 ENCOUNTER — Other Ambulatory Visit: Payer: Self-pay | Admitting: Family Medicine

## 2024-03-19 DIAGNOSIS — F320 Major depressive disorder, single episode, mild: Secondary | ICD-10-CM

## 2024-03-22 DIAGNOSIS — R531 Weakness: Secondary | ICD-10-CM | POA: Diagnosis not present

## 2024-03-22 DIAGNOSIS — L400 Psoriasis vulgaris: Secondary | ICD-10-CM | POA: Diagnosis not present

## 2024-03-26 ENCOUNTER — Other Ambulatory Visit: Payer: Self-pay | Admitting: Family Medicine

## 2024-03-26 DIAGNOSIS — E559 Vitamin D deficiency, unspecified: Secondary | ICD-10-CM

## 2024-04-27 DIAGNOSIS — L400 Psoriasis vulgaris: Secondary | ICD-10-CM | POA: Diagnosis not present

## 2024-04-27 DIAGNOSIS — L821 Other seborrheic keratosis: Secondary | ICD-10-CM | POA: Diagnosis not present

## 2024-05-12 DIAGNOSIS — L400 Psoriasis vulgaris: Secondary | ICD-10-CM | POA: Diagnosis not present

## 2024-05-20 ENCOUNTER — Telehealth: Payer: Self-pay

## 2024-05-20 NOTE — Telephone Encounter (Signed)
 I called the patient and left a message stating the following:   Our office has you scheduled for a new patient appointment on 05/25/2024, with an arrival time of 2:25 PM. Please remember to bring your insurance card, photo ID, and a medication list of anything you're currently taking at this time. Should you need to cancel the appointment, we do require a 24-hour notice. If the appointment is canceled the day of or no showed, then we will not be able to reschedule your new patient appointment.  Please call the office back at [phone] to confirm your appointment and to go over any further registration questions we may have. Please ask to speak with someone directly at the office.  Thank you for your time and we look forward to speaking with you.

## 2024-05-25 ENCOUNTER — Encounter

## 2024-06-18 ENCOUNTER — Other Ambulatory Visit: Payer: Self-pay | Admitting: Family Medicine

## 2024-06-18 DIAGNOSIS — F320 Major depressive disorder, single episode, mild: Secondary | ICD-10-CM

## 2024-06-20 ENCOUNTER — Ambulatory Visit

## 2024-06-20 VITALS — BP 106/64 | HR 70 | Temp 98.0°F | Ht 62.0 in | Wt 229.0 lb

## 2024-06-20 DIAGNOSIS — F320 Major depressive disorder, single episode, mild: Secondary | ICD-10-CM

## 2024-06-20 DIAGNOSIS — E782 Mixed hyperlipidemia: Secondary | ICD-10-CM

## 2024-06-20 DIAGNOSIS — L409 Psoriasis, unspecified: Secondary | ICD-10-CM

## 2024-06-20 DIAGNOSIS — E785 Hyperlipidemia, unspecified: Secondary | ICD-10-CM | POA: Diagnosis not present

## 2024-06-20 DIAGNOSIS — M79641 Pain in right hand: Secondary | ICD-10-CM

## 2024-06-20 DIAGNOSIS — Z6841 Body Mass Index (BMI) 40.0 and over, adult: Secondary | ICD-10-CM | POA: Insufficient documentation

## 2024-06-20 DIAGNOSIS — G4733 Obstructive sleep apnea (adult) (pediatric): Secondary | ICD-10-CM

## 2024-06-20 DIAGNOSIS — E559 Vitamin D deficiency, unspecified: Secondary | ICD-10-CM

## 2024-06-20 DIAGNOSIS — R7303 Prediabetes: Secondary | ICD-10-CM | POA: Diagnosis not present

## 2024-06-20 DIAGNOSIS — M2550 Pain in unspecified joint: Secondary | ICD-10-CM | POA: Insufficient documentation

## 2024-06-20 DIAGNOSIS — M79642 Pain in left hand: Secondary | ICD-10-CM | POA: Diagnosis not present

## 2024-06-20 NOTE — Patient Instructions (Signed)
  VISIT SUMMARY: Today, you visited us  for a new patient appointment to manage your osteoarthritis, Dupuytren's contracture, dyslipidemia, psoriasis, sleep apnea, exertional dyspnea, and cognitive impairment. We discussed your current medications and made some adjustments to your treatment plan.  YOUR PLAN: OSTEOARTHRITIS AND DUPUYTREN'S CONTRACTURE: You have chronic pain in your hands due to osteoarthritis and Dupuytren's contracture, especially in your left hand. -We will refill your gabapentin  prescription for pain management.  HYPERLIPIDEMIA: You have high cholesterol managed with fenofibrate , lovastatin , and fish oil. -We will order a lipid panel as part of your blood work. -We will refill your prescriptions for fenofibrate , lovastatin , and fish oil. -Try to reduce your intake of red meat and increase plant-based foods to help manage your cholesterol.  IMPAIRED FASTING GLUCOSE: You have borderline diabetes managed with dietary changes. -We will order an HbA1c test as part of your blood work. -Continue to include fruits and vegetables in your diet and try to increase plant-based foods and exercise to manage your weight and glucose levels.  OBSTRUCTIVE SLEEP APNEA: You have sleep apnea managed with a CPAP machine. -Continue using your CPAP machine as directed. -Your CPAP doctor mentioned potential weight loss intervention if your weight does not decrease.  VITAMIN D  DEFICIENCY: We need to check your vitamin D  levels. -We will order a vitamin D  level test as part of your blood work.  MEMORY CONCERNS: You have memory issues, particularly with recalling names and words, and a family history of Alzheimer's disease. -Engage in activities like crossword puzzles and reading to maintain cognitive function. -Follow a Mediterranean diet and regular exercise to support your memory.                      Contains text generated by [employer].                                  Contains text generated by [employer]bridge.

## 2024-06-20 NOTE — Progress Notes (Unsigned)
 Subjective:  Patient ID: Sydney Thompson, female    DOB: 1960/11/02  Age: 63 y.o. MRN: [id number]  Chief Complaint  Patient presents with  . Establish Care    HPI:  Patient is establishing as a new patient.     06/20/2024    2:45 PM 03/11/2023    1:44 PM 02/20/2023    2:38 PM 02/20/2023    2:37 PM 02/18/2022    1:17 PM  Depression screen PHQ 2/9  Decreased Interest 0 0 0 0 0  Down, Depressed, Hopeless 0 0 0 0 0  PHQ - 2 Score 0 0 0 0 0  Altered sleeping 0  0    Tired, decreased energy 2  0    Change in appetite 0  0    Feeling bad or failure about yourself  0  0    Trouble concentrating 0  0    Moving slowly or fidgety/restless 0  0    Suicidal thoughts 0  0    PHQ-9 Score 2  0    Difficult doing work/chores Not difficult at all  Not difficult at all           01/14/2022    1:47 PM 02/18/2022    1:17 PM 02/20/2023    2:37 PM 03/11/2023    1:42 PM 06/20/2024    2:43 PM  Fall Risk  Falls in the past year? 1 0 0 0 0  Was there an injury with Fall? 0 0 0 0 0  Fall Risk Category Calculator 1 0 0 0 0  Fall Risk Category (Retired) Low  Low      (RETIRED) Patient Fall Risk Level Moderate fall risk  Low fall risk      Patient at Risk for Falls Due to History of fall(s);Orthopedic patient   No Fall Risks No Fall Risks  Fall risk Follow up Education provided;Falls prevention discussed  Falls evaluation completed  Falls evaluation completed Falls evaluation completed Falls evaluation completed     Data saved with a previous flowsheet row definition     Current Outpatient Medications on File Prior to Visit  Medication Sig Dispense Refill  . Chlorpheniramine Maleate (ALLERGY PO) Take 10 mg by mouth daily.    . fenofibrate  160 MG tablet TAKE 1 TABLET BY MOUTH DAILY 100 tablet 2  . gabapentin  (NEURONTIN ) 300 MG capsule Take 1 capsule (300 mg total) by mouth at bedtime. 90 capsule 3  . lovastatin  (MEVACOR ) 20 MG tablet TAKE 1 TABLET BY MOUTH AT  BEDTIME 100 tablet 2  . Multiple  Vitamin (MULTI-VITAMIN DAILY PO) Take by mouth.    . Omega-3 Fatty Acids (FISH OIL) 1000 MG CAPS Take by mouth.    . sertraline  (ZOLOFT ) 100 MG tablet Take 1.5 tablets (150 mg total) by mouth daily. 45 tablet 0  . TREMFYA 100 MG/ML SOSY Inject 1 Syringe into the skin every 8 (eight) weeks.    . Vitamin D , Ergocalciferol , (DRISDOL ) 1.25 MG (50000 UNIT) CAPS capsule TAKE 1 CAPSULE BY MOUTH EVERY 7  DAYS 15 capsule 2   No current facility-administered medications on file prior to visit.  . Social History   Socioeconomic History  . Marital status: Media planner    Spouse name: Not on file  . Number of children: Not on file  . Years of education: Not on file  . Highest education level: Not on file  Occupational History  . 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 Smoking status: Never  . Smokeless tobacco: Never  Substance and Sexual Activity  . Alcohol use: No  . Drug use: No  . Sexual activity: Yes    Partners: Female    Birth control/protection: None  Other Topics Concern  . Not on file  Social History Narrative   Exercise-- walking some    Social Drivers of Health   Financial Resource Strain: Low Risk  (01/14/2022)   Overall Financial Resource Strain (CARDIA)   . Difficulty of Paying Living Expenses: Not hard at all  Food Insecurity: No Food Insecurity (03/11/2023)   Hunger Vital Sign   . Worried About Programme researcher, broadcasting/film/video in the Last Year: Never true   . Ran Out of Food in the Last Year: Never true  Transportation Needs: No Transportation Needs (03/11/2023)   PRAPARE - Transportation   . Lack of Transportation (Medical): No   . Lack of Transportation (Non-Medical): No  Physical Activity: Unknown (01/14/2022)   Exercise Vital Sign   . Days of Exercise per Week: 0 days   . Minutes of Exercise per Session: Not on file  Stress: No Stress Concern Present (01/14/2022)   [employer] of Occupational Health - Occupational Stress  Questionnaire   . Feeling of Stress : Only a little  Social Connections: Moderately Integrated (01/14/2022)   Social Connection and Isolation Panel   . Frequency of Communication with Friends and Family: More than three times a week   . Frequency of Social Gatherings with Friends and Family: More than three times a week   . Attends Religious Services: Never   . Active Member of Clubs or Organizations: Yes   . Attends Banker Meetings: 1 to 4 times per year   . Marital Status: Living with partner   Past Medical History:  Diagnosis Date  . Depression   . GERD (gastroesophageal reflux disease)   . IBS (irritable bowel syndrome)    Family History  Problem Relation Age of Onset  . Cancer Mother        breast  . Alzheimer's disease Mother   . Stroke Father   . Heart disease Father        MI  . Hyperlipidemia Father   . Diabetes Father   . Diabetes Paternal Uncle   . Diabetes Paternal Uncle   . Diabetes Paternal Uncle     Review of Systems  Constitutional:  Negative for chills, fatigue and fever.  HENT:  Negative for congestion, ear pain, sinus pressure and sore throat.   Respiratory:  Negative for cough and shortness of breath.   Cardiovascular:  Negative for chest pain.  Gastrointestinal:  Negative for abdominal pain, constipation, diarrhea, nausea and vomiting.  Genitourinary:  Negative for dysuria and frequency.  Musculoskeletal:  Positive for arthralgias. Negative for back pain and myalgias.  Neurological:  Negative for dizziness and headaches.  Psychiatric/Behavioral:  Negative for dysphoric mood. The patient is not nervous/anxious.      Objective:  BP 106/64   Pulse 70   Temp 98 F (36.7 C)   Ht 5' 2 (1.575 m)   Wt 229 lb (103.9 kg)   SpO2 95%   BMI 41.88 kg/m      06/20/2024    2:36 PM 08/24/2023    2:21 PM 02/20/2023    2:34 PM  BP/Weight  Systolic BP 106 104 100  Diastolic BP 64 80 70  Wt. (Lbs) 229 223 221.8  BMI 41.88 kg/m2 40.79 kg/m2  40.57 kg/m2    Physical Exam {Perform Simple Foot Exam  Perform Detailed exam:1} {Insert foot Exam (Optional):30965}   Lab Results  Component Value Date   WBC 6.1 08/24/2023   HGB 12.7 08/24/2023   HCT 39.3 08/24/2023   PLT 194.0 08/24/2023   GLUCOSE 84 08/24/2023   CHOL 142 08/24/2023   TRIG 177.0 (H) 08/24/2023   HDL 36.70 (L) 08/24/2023   LDLDIRECT 89.0 08/21/2022   LDLCALC 70 08/24/2023   ALT 23 08/24/2023   AST 28 08/24/2023   NA 138 08/24/2023   K 4.1 08/24/2023   CL 104 08/24/2023   CREATININE 0.96 08/24/2023   BUN 25 (H) 08/24/2023   CO2 23 08/24/2023   TSH 4.12 08/24/2023   INR 1.0 07/17/2009   HGBA1C 5.9 08/16/2018   MICROALBUR 10.6 08/10/2020      Assessment & Plan:  Vitamin D  deficiency  Morbid (severe) obesity due to excess calories (HCC)  Mixed hyperlipidemia  OSA (obstructive sleep apnea)      No orders of the defined types were placed in this encounter.  No orders of the defined types were placed in this encounter.     Follow-up: No follow-ups on file.  AVS was given to patient prior to departure.  Tommy Schimke [facility] [phone]

## 2024-06-21 ENCOUNTER — Ambulatory Visit: Payer: Self-pay

## 2024-06-21 LAB — COMPREHENSIVE METABOLIC PANEL WITH GFR
ALT: 23 IU/L (ref 0–32)
AST: 31 IU/L (ref 0–40)
Albumin: 4.1 g/dL (ref 3.9–4.9)
Alkaline Phosphatase: 49 IU/L (ref 44–121)
BUN/Creatinine Ratio: 24 (ref 12–28)
BUN: 21 mg/dL (ref 8–27)
Bilirubin Total: 0.3 mg/dL (ref 0.0–1.2)
CO2: 19 mmol/L — ABNORMAL LOW (ref 20–29)
Calcium: 9.4 mg/dL (ref 8.7–10.3)
Chloride: 107 mmol/L — ABNORMAL HIGH (ref 96–106)
Creatinine, Ser: 0.88 mg/dL (ref 0.57–1.00)
Globulin, Total: 2.4 g/dL (ref 1.5–4.5)
Glucose: 92 mg/dL (ref 70–99)
Potassium: 4.3 mmol/L (ref 3.5–5.2)
Sodium: 140 mmol/L (ref 134–144)
Total Protein: 6.5 g/dL (ref 6.0–8.5)
eGFR: 73 mL/min/1.73 (ref >59)

## 2024-06-21 LAB — CBC WITH DIFFERENTIAL/PLATELET
Basophils Absolute: 0 x10E3/uL (ref 0.0–0.2)
Basos: 1 %
EOS (ABSOLUTE): 0.2 x10E3/uL (ref 0.0–0.4)
Eos: 3 %
Hematocrit: 36.5 % (ref 34.0–46.6)
Hemoglobin: 11.8 g/dL (ref 11.1–15.9)
Immature Grans (Abs): 0 x10E3/uL (ref 0.0–0.1)
Immature Granulocytes: 0 %
Lymphocytes Absolute: 1 x10E3/uL (ref 0.7–3.1)
Lymphs: 20 %
MCH: 32.2 pg (ref 26.6–33.0)
MCHC: 32.3 g/dL (ref 31.5–35.7)
MCV: 100 fL — ABNORMAL HIGH (ref 79–97)
Monocytes Absolute: 0.4 x10E3/uL (ref 0.1–0.9)
Monocytes: 8 %
Neutrophils Absolute: 3.6 x10E3/uL (ref 1.4–7.0)
Neutrophils: 67 %
Platelets: 160 x10E3/uL (ref 150–450)
RBC: 3.67 x10E6/uL — ABNORMAL LOW (ref 3.77–5.28)
RDW: 12.7 % (ref 11.7–15.4)
WBC: 5.3 x10E3/uL (ref 3.4–10.8)

## 2024-06-21 LAB — HEMOGLOBIN A1C
Est. average glucose Bld gHb Est-mCnc: 105 mg/dL
Hgb A1c MFr Bld: 5.3 % (ref 4.8–5.6)

## 2024-06-21 LAB — LIPID PANEL
Chol/HDL Ratio: 3.8 ratio (ref 0.0–4.4)
Cholesterol, Total: 136 mg/dL (ref 100–199)
HDL: 36 mg/dL — ABNORMAL LOW (ref >39)
LDL Chol Calc (NIH): 73 mg/dL (ref 0–99)
Triglycerides: 154 mg/dL — ABNORMAL HIGH (ref 0–149)
VLDL Cholesterol Cal: 27 mg/dL (ref 5–40)

## 2024-06-21 LAB — VITAMIN D 25 HYDROXY (VIT D DEFICIENCY, FRACTURES): Vit D, 25-Hydroxy: 49.6 ng/mL (ref 30.0–100.0)

## 2024-06-21 MED ORDER — SERTRALINE HCL 100 MG PO TABS: 150.0000 mg | ORAL_TABLET | Freq: Every day | ORAL | 1 refills | Status: DC

## 2024-06-21 MED ORDER — LOVASTATIN 20 MG PO TABS: 20.0000 mg | ORAL_TABLET | Freq: Every day | ORAL | 1 refills | Status: DC

## 2024-06-21 MED ORDER — VITAMIN D (ERGOCALCIFEROL) 1.25 MG (50000 UNIT) PO CAPS: 50000.0000 [IU] | ORAL_CAPSULE | ORAL | 1 refills | Status: AC

## 2024-06-21 MED ORDER — GABAPENTIN 300 MG PO CAPS: 300.0000 mg | ORAL_CAPSULE | Freq: Every day | ORAL | 1 refills | Status: DC

## 2024-06-21 MED ORDER — FENOFIBRATE 160 MG PO TABS: 160.0000 mg | ORAL_TABLET | Freq: Every day | ORAL | 2 refills | Status: AC

## 2024-06-21 NOTE — Assessment & Plan Note (Signed)
 Chronic hyperlipidemia managed with fenofibrate , lovastatin , and fish oil. Advised to reduce red meat intake and increase plant-based foods for better cholesterol management. - Order lipid panel as part of blood work - Refill fenofibrate , lovastatin , and fish oil

## 2024-06-21 NOTE — Assessment & Plan Note (Signed)
 Class 3 severe obesity with BMI 41.88 with major comorbidity of Hyperlipidemia and Obstructive Sleep Apnea.  Will discuss weight management at future visits. Continue to work on low calorie diet and physical activity as tolerated.

## 2024-06-21 NOTE — Assessment & Plan Note (Signed)
 Obstructive sleep apnea managed with CPAP machine. CPAP doctor mentioned potential weight loss intervention if weight does not decrease.

## 2024-06-21 NOTE — Assessment & Plan Note (Signed)
 Osteoarthritis of hands and Dupuytren's contracture of left hand Chronic pain in hands due to osteoarthritis and Dupuytren's contracture in the left hand. Gabapentin  is used for pain management. Previous hand doctor retired, and current Careers adviser is not frequently visited as she is not ready for more surgery. - Refill gabapentin  for pain management

## 2024-06-21 NOTE — Assessment & Plan Note (Signed)
-   Order vitamin D  level as part of blood work

## 2024-07-11 DIAGNOSIS — L400 Psoriasis vulgaris: Secondary | ICD-10-CM | POA: Diagnosis not present

## 2024-07-18 DIAGNOSIS — L4050 Arthropathic psoriasis, unspecified: Secondary | ICD-10-CM | POA: Diagnosis not present

## 2024-09-03 ENCOUNTER — Other Ambulatory Visit: Payer: Self-pay

## 2024-09-03 DIAGNOSIS — M79642 Pain in left hand: Secondary | ICD-10-CM

## 2024-09-03 DIAGNOSIS — E785 Hyperlipidemia, unspecified: Secondary | ICD-10-CM

## 2024-10-10 ENCOUNTER — Ambulatory Visit

## 2024-10-10 VITALS — BP 96/60 | HR 67 | Temp 97.4°F | Resp 16 | Ht 62.0 in | Wt 226.0 lb

## 2024-10-10 DIAGNOSIS — E782 Mixed hyperlipidemia: Secondary | ICD-10-CM | POA: Diagnosis not present

## 2024-10-10 DIAGNOSIS — Z6841 Body Mass Index (BMI) 40.0 and over, adult: Secondary | ICD-10-CM

## 2024-10-10 DIAGNOSIS — E66813 Obesity, class 3: Secondary | ICD-10-CM | POA: Insufficient documentation

## 2024-10-10 DIAGNOSIS — G4733 Obstructive sleep apnea (adult) (pediatric): Secondary | ICD-10-CM | POA: Diagnosis not present

## 2024-10-10 DIAGNOSIS — F331 Major depressive disorder, recurrent, moderate: Secondary | ICD-10-CM | POA: Diagnosis not present

## 2024-10-10 NOTE — Assessment & Plan Note (Addendum)
 With comorbidities of obstructive sleep apnea, hyperlipidemia and Weight has increased slightly. Discussed dietary habits and physical activity. Encouraged to eat smaller portions, increase fiber intake, and consume more protein. Advised to drink more water and increase physical activity to aid weight loss and reduce hunger. - Encouraged dietary modifications including smaller portions, increased fiber, and protein intake - Advised increased physical activity - Discussed potential benefits of Zepbound for weight management which was sent in by the sleep specialist with her diagnosis of OSA but the copay was extremely high. - she is going to try to get the medicine while working on lifestyle changes    Psoriasis Managed with Tremfya injections every two months. - Continue Tremfya injections every two months  Chronic pain syndrome Managed with gabapentin  300 mg at bedtime. - Continue gabapentin  300 mg at bedtime   General Health Maintenance Routine health maintenance discussed. Up to date with dental exams. Mammogram due in March. - Will schedule fasting labs in March - Ensure mammogram is completed in March

## 2024-10-10 NOTE — Assessment & Plan Note (Signed)
 Currently managed with Zoloft  150 mg daily. - Continue Zoloft  150 mg daily

## 2024-10-10 NOTE — Progress Notes (Signed)
 Subjective:  Patient ID: Sydney Thompson, female    DOB: Dec 28, 1960  Age: 63 y.o. MRN: [id number]  Chief Complaint  Patient presents with   Medical Management of Chronic Issues    HPI: Discussed the use of AI scribe software for clinical note transcription with the patient, who gave verbal consent to proceed.  Discussed the use of AI scribe software for clinical note transcription with the patient, who gave verbal consent to proceed.  History of Present Illness   Sydney Thompson is a 63 year old female who presents for a four-month follow-up visit.  Dyslipidemia - Currently taking fenofibrate  160 mg daily, lovastatin  20 mg daily, and fish oil 1000 mg once daily - Recent blood work showed low HDL and low LDL - Dietary efforts ongoing, with more 'bad days' than good - Does not eat breakfast - Partner prepares most meals, which are low in salt  Major depressive disorder - Takes sertraline  (Zoloft ) 150 mg daily - Mood stable since last visit in August  Psoriasis - Managed with Tremfya injections every two months - No new symptoms or issues  Obstructive sleep apnea - Uses CPAP machine nightly - Compliant with therapy  Chronic pain - Takes gabapentin  300 mg at bedtime - Describes experiencing 'lots of pain'  Vitamin d  status - On vitamin D  supplementation - Vitamin D  levels excellent on last blood work            06/20/2024    2:45 PM 03/11/2023    1:44 PM 02/20/2023    2:38 PM 02/20/2023    2:37 PM 02/18/2022    1:17 PM  Depression screen PHQ 2/9  Decreased Interest 0 0 0 0 0  Down, Depressed, Hopeless 0 0 0 0 0  PHQ - 2 Score 0 0 0 0 0  Altered sleeping 0  0    Tired, decreased energy 2  0    Change in appetite 0  0    Feeling bad or failure about yourself  0  0    Trouble concentrating 0  0    Moving slowly or fidgety/restless 0  0    Suicidal thoughts 0  0    PHQ-9 Score 2   0     Difficult doing work/chores Not difficult at all  Not difficult at all        Data saved with a previous flowsheet row definition        06/20/2024    2:43 PM  Fall Risk   Falls in the past year? 0  Number falls in past yr: 0  Injury with Fall? 0   Risk for fall due to : No Fall Risks  Follow up Falls evaluation completed     Data saved with a previous flowsheet row definition    Patient Care Team: Neils Siracusa, MD as PCP - General (Family Medicine) Dunzweiler, Hoy SQUIBB, RN as Registered Nurse (Dermatology) Chodri, Tanvir, MD as Referring Physician (Sleep Medicine) Gretel Ozell PARAS, DPM (Inactive) as Consulting Physician (Podiatry) Trudy Lynwood DASEN, MD as Referring Physician (Dermatology) Shari Easter, MD as Consulting Physician (Orthopedic Surgery)   Review of Systems  Constitutional:  Negative for chills, fatigue and fever.  HENT:  Negative for congestion, ear pain and sore throat.   Respiratory:  Negative for cough and shortness of breath.   Cardiovascular:  Negative for chest pain and palpitations.  Gastrointestinal:  Negative for abdominal pain, constipation, diarrhea, nausea and vomiting.  Endocrine: Negative for polydipsia, polyphagia and polyuria.  Genitourinary:  Negative for difficulty urinating and dysuria.  Musculoskeletal:  Negative for arthralgias, back pain and myalgias.  Skin:  Negative for rash.  Neurological:  Negative for headaches.  Psychiatric/Behavioral:  Negative for dysphoric mood. The patient is not nervous/anxious.     Medications Ordered Prior to Encounter[1] Past Medical History:  Diagnosis Date   Depression    GERD (gastroesophageal reflux disease)    IBS (irritable bowel syndrome)    Past Surgical History:  Procedure Laterality Date   CHOLECYSTECTOMY     GALLBLADDER SURGERY     LITHOTRIPSY     Dr. 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    Comment: retired  Tobacco Use   Smoking status: Never   Smokeless tobacco: Never  Substance and Sexual Activity   Alcohol use: No   Drug use: No   Sexual activity: Yes    Partners: Female    Birth control/protection: None  Other Topics Concern   Not on file  Social History Narrative   Exercise-- walking some    Social Drivers of Health   Tobacco Use: Low Risk (06/20/2024)   Patient History    Smoking Tobacco Use: Never    Smokeless Tobacco Use: Never    Passive Exposure: Not on file  Financial Resource Strain: Low Risk (01/14/2022)   Overall Financial Resource Strain (CARDIA)    Difficulty of Paying Living Expenses: Not hard at all  Food Insecurity: No Food Insecurity (03/11/2023)   Hunger Vital Sign    Worried About Running Out of Food in the Last Year: Never true    Ran Out of Food in the Last Year: Never true  Transportation Needs: No Transportation Needs (03/11/2023)   PRAPARE - Administrator, Civil Service (Medical): No    Lack of Transportation (Non-Medical): No  Physical Activity: Unknown (01/14/2022)   Exercise Vital Sign    Days of Exercise per Week: 0 days    Minutes of Exercise per Session: Not on file  Stress: No Stress Concern Present (01/14/2022)   [employer] of Occupational Health - Occupational Stress Questionnaire    Feeling of Stress : Only a little  Social Connections: Moderately Integrated (01/14/2022)   Social Connection and Isolation Panel    Frequency of Communication with Friends and Family: More than three times a week    Frequency of Social  Gatherings with Friends and Family: More than three times a week    Attends Religious Services: Never    Database Administrator or Organizations: Yes    Attends Banker Meetings: 1 to 4 times per year    Marital Status: Living with partner  Depression (PHQ2-9): Low Risk (06/20/2024)   Depression (PHQ2-9)    PHQ-2 Score: 2  Alcohol Screen: Low Risk (03/11/2023)   Alcohol Screen    Last Alcohol Screening Score (AUDIT): 1  Housing: Low Risk (03/11/2023)   Housing    Last Housing Risk Score: 0  Utilities: Not At Risk (03/11/2023)   AHC Utilities    Threatened with loss of utilities: No  Health Literacy: Not on file    Objective:  There were no vitals taken for this visit.     06/20/2024    2:36 PM 08/24/2023    2:21 PM 02/20/2023    2:34 PM  BP/Weight  Systolic BP 106 104 100  Diastolic BP 64 80 70  Wt. (Lbs) 229 223 221.8  BMI 41.88 kg/m2 40.79 kg/m2 40.57 kg/m2    Physical Exam Vitals and nursing note reviewed.  Constitutional:      Appearance: She is obese.  HENT:     Head: Normocephalic and atraumatic.  Cardiovascular:     Rate and Rhythm: Normal rate and regular rhythm.  Pulmonary:     Effort: Pulmonary effort is normal.     Breath sounds: Normal breath sounds.  Musculoskeletal:        General: Normal range of motion.     Cervical back: Normal range of motion.  Neurological:     General: No focal deficit present.     Mental Status: She is alert and oriented to person, place, and time.  Psychiatric:        Mood and Affect: Mood normal.         Lab Results  Component Value Date   WBC 5.3 06/20/2024   HGB 11.8 06/20/2024   HCT 36.5 06/20/2024   PLT 160 06/20/2024   GLUCOSE 92 06/20/2024   CHOL 136 06/20/2024   TRIG 154 (H) 06/20/2024   HDL 36 (L) 06/20/2024   LDLDIRECT 89.0 08/21/2022   LDLCALC 73 06/20/2024   ALT 23 06/20/2024   AST 31 06/20/2024   NA 140 06/20/2024   K 4.3 06/20/2024   CL 107 (H) 06/20/2024   CREATININE 0.88  06/20/2024   BUN 21 06/20/2024   CO2 19 (L) 06/20/2024   TSH 4.12 08/24/2023   INR 1.0 07/17/2009   HGBA1C 5.3 06/20/2024    Results for orders placed or performed in visit on 06/20/24  Comprehensive metabolic panel with GFR   Collection Time: 06/20/24  3:16 PM  Result Value Ref Range   Glucose 92 70 - 99 mg/dL   BUN 21 8 - 27 mg/dL   Creatinine, Ser 9.11 0.57 - 1.00 mg/dL   eGFR 73 >40 fO/fpw/8.26   BUN/Creatinine Ratio 24 12 - 28   Sodium 140 134 - 144 mmol/L   Potassium 4.3 3.5 - 5.2 mmol/L   Chloride 107 (H) 96 - 106 mmol/L   CO2 19 (L) 20 - 29 mmol/L   Calcium  9.4 8.7 - 10.3 mg/dL   Total Protein 6.5 6.0 - 8.5 g/dL   Albumin 4.1 3.9 - 4.9 g/dL   Globulin, Total 2.4 1.5 - 4.5 g/dL   Bilirubin Total 0.3 0.0 - 1.2 mg/dL   Alkaline Phosphatase 49 44 - 121 IU/L   AST 31 0 - 40 IU/L   ALT 23 0 - 32 IU/L  Hemoglobin A1c   Collection Time: 06/20/24  3:16 PM  Result Value Ref Range   Hgb A1c MFr Bld 5.3 4.8 - 5.6 %   Est. average glucose Bld gHb Est-mCnc 105 mg/dL  Lipid panel   Collection Time: 06/20/24  3:16 PM  Result Value  Ref Range   Cholesterol, Total 136 100 - 199 mg/dL   Triglycerides 845 (H) 0 - 149 mg/dL   HDL 36 (L) >60 mg/dL   VLDL Cholesterol Cal 27 5 - 40 mg/dL   LDL Chol Calc (NIH) 73 0 - 99 mg/dL   Chol/HDL Ratio 3.8 0.0 - 4.4 ratio  CBC with Differential/Platelet   Collection Time: 06/20/24  3:16 PM  Result Value Ref Range   WBC 5.3 3.4 - 10.8 x10E3/uL   RBC 3.67 (L) 3.77 - 5.28 x10E6/uL   Hemoglobin 11.8 11.1 - 15.9 g/dL   Hematocrit 63.4 65.9 - 46.6 %   MCV 100 (H) 79 - 97 fL   MCH 32.2 26.6 - 33.0 pg   MCHC 32.3 31.5 - 35.7 g/dL   RDW 87.2 88.2 - 84.5 %   Platelets 160 150 - 450 x10E3/uL   Neutrophils 67 Not Estab. %   Lymphs 20 Not Estab. %   Monocytes 8 Not Estab. %   Eos 3 Not Estab. %   Basos 1 Not Estab. %   Neutrophils Absolute 3.6 1.4 - 7.0 x10E3/uL   Lymphocytes Absolute 1.0 0.7 - 3.1 x10E3/uL   Monocytes Absolute 0.4 0.1 - 0.9  x10E3/uL   EOS (ABSOLUTE) 0.2 0.0 - 0.4 x10E3/uL   Basophils Absolute 0.0 0.0 - 0.2 x10E3/uL   Immature Granulocytes 0 Not Estab. %   Immature Grans (Abs) 0.0 0.0 - 0.1 x10E3/uL  Vitamin D , 25-hydroxy   Collection Time: 06/20/24  3:22 PM  Result Value Ref Range   Vit D, 25-Hydroxy 49.6 30.0 - 100.0 ng/mL  .  Assessment & Plan:   Assessment & Plan OSA (obstructive sleep apnea) Obstructive sleep apnea Managed with CPAP machine used nightly. Discussed the importance of consistent use to manage symptoms. - Continue using CPAP machine nightly Mixed hyperlipidemia Hyperlipidemia Cholesterol levels are well-managed with current medications. HDL is slightly low, but LDL is low. Vitamin D  levels are excellent. - Continue fenofibrate  160 mg daily - Continue lovastatin  20 mg daily - Continue fish oil 1000 mg daily Major depressive disorder, recurrent, moderate (HCC) Currently managed with Zoloft  150 mg daily. - Continue Zoloft  150 mg daily Class 3 severe obesity due to excess calories with serious comorbidity and body mass index (BMI) of 40.0 to 44.9 in adult [facility]) With comorbidities of obstructive sleep apnea, hyperlipidemia and Weight has increased slightly. Discussed dietary habits and physical activity. Encouraged to eat smaller portions, increase fiber intake, and consume more protein. Advised to drink more water and increase physical activity to aid weight loss and reduce hunger. - Encouraged dietary modifications including smaller portions, increased fiber, and protein intake - Advised increased physical activity - Discussed potential benefits of Zepbound for weight management which was sent in by the sleep specialist with her diagnosis of OSA but the copay was extremely high. - she is going to try to get the medicine while working on lifestyle changes    Psoriasis Managed with Tremfya injections every two months. - Continue Tremfya injections every two months  Chronic pain  syndrome Managed with gabapentin  300 mg at bedtime. - Continue gabapentin  300 mg at bedtime   General Health Maintenance Routine health maintenance discussed. Up to date with dental exams. Mammogram due in March. - Will schedule fasting labs in March - Ensure mammogram is completed in March    There is no height or weight on file to calculate BMI.           No orders of  the defined types were placed in this encounter.   No orders of the defined types were placed in this encounter.      Follow-up: No follow-ups on file.  An After Visit Summary was printed and given to the patient.  Tommy Schimke, MD [facility] [phone]     [1]  Current Outpatient Medications on File Prior to Visit  Medication Sig Dispense Refill   Chlorpheniramine Maleate (ALLERGY PO) Take 10 mg by mouth daily.     fenofibrate  160 MG tablet Take 1 tablet (160 mg total) by mouth daily. 100 tablet 2   gabapentin  (NEURONTIN ) 300 MG capsule TAKE 1 CAPSULE BY MOUTH AT  BEDTIME 100 capsule 2   lovastatin  (MEVACOR ) 20 MG tablet TAKE 1 TABLET BY MOUTH AT  BEDTIME 100 tablet 2   Multiple Vitamin (MULTI-VITAMIN DAILY PO) Take by mouth.     Omega-3 Fatty Acids (FISH OIL) 1000 MG CAPS Take by mouth.     sertraline  (ZOLOFT ) 100 MG tablet Take 1.5 tablets (150 mg total) by mouth daily. 135 tablet 1   TREMFYA 100 MG/ML SOSY Inject 1 Syringe into the skin every 8 (eight) weeks.     Vitamin D , Ergocalciferol , (DRISDOL ) 1.25 MG (50000 UNIT) CAPS capsule Take 1 capsule (50,000 Units total) by mouth every 7 (seven) days. 15 capsule 1   No current facility-administered medications on file prior to visit.

## 2024-10-10 NOTE — Assessment & Plan Note (Signed)
 Obstructive sleep apnea Managed with CPAP machine used nightly. Discussed the importance of consistent use to manage symptoms. - Continue using CPAP machine nightly

## 2024-10-10 NOTE — Assessment & Plan Note (Signed)
 Hyperlipidemia Cholesterol levels are well-managed with current medications. HDL is slightly low, but LDL is low. Vitamin D  levels are excellent. - Continue fenofibrate  160 mg daily - Continue lovastatin  20 mg daily - Continue fish oil 1000 mg daily

## 2024-11-14 ENCOUNTER — Telehealth: Payer: Self-pay

## 2024-11-14 DIAGNOSIS — L409 Psoriasis, unspecified: Secondary | ICD-10-CM

## 2024-11-14 DIAGNOSIS — G4733 Obstructive sleep apnea (adult) (pediatric): Secondary | ICD-10-CM

## 2024-11-14 NOTE — Telephone Encounter (Unsigned)
 Copied from CRM [id number]. Topic: Referral - Question >> Nov 14, 2024  1:07 PM Winona SAUNDERS wrote: Pt insurance is requiring referrals for Dermetology and Pulmonary. Pt is already scheduled and established with both practices but need a referral to continue. [facility]  Dermatologist in [place], [place] [address] [place], [place] [address]  [facility] in [place], [place] [address] [place], [place] [address]

## 2024-11-15 NOTE — Telephone Encounter (Signed)
Referrals has been placed. 

## 2024-12-02 ENCOUNTER — Other Ambulatory Visit: Payer: Self-pay

## 2024-12-02 DIAGNOSIS — F320 Major depressive disorder, single episode, mild: Secondary | ICD-10-CM

## 2025-01-09 ENCOUNTER — Ambulatory Visit
